# Patient Record
Sex: Female | Born: 1995 | Race: White | Hispanic: No | Marital: Married | Smoking: Never smoker
Health system: Southern US, Community
[De-identification: ages and names within clinical notes are randomized; demographics above are authoritative.]

## PROBLEM LIST (undated history)

## (undated) DIAGNOSIS — R569 Unspecified convulsions: Secondary | ICD-10-CM

## (undated) DIAGNOSIS — F988 Other specified behavioral and emotional disorders with onset usually occurring in childhood and adolescence: Secondary | ICD-10-CM

## (undated) DIAGNOSIS — G43909 Migraine, unspecified, not intractable, without status migrainosus: Secondary | ICD-10-CM

## (undated) HISTORY — DX: Other specified behavioral and emotional disorders with onset usually occurring in childhood and adolescence: F98.8

## (undated) HISTORY — PX: NO PAST SURGERIES: SHX2092

## (undated) HISTORY — DX: Unspecified convulsions: R56.9

---
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
## 2015-02-22 ENCOUNTER — Encounter: Payer: Self-pay | Admitting: Neurology

## 2015-02-22 ENCOUNTER — Ambulatory Visit (INDEPENDENT_AMBULATORY_CARE_PROVIDER_SITE_OTHER): Payer: Managed Care, Other (non HMO) | Admitting: Neurology

## 2015-02-22 VITALS — BP 102/70 | Ht 61.0 in | Wt 190.8 lb

## 2015-02-22 DIAGNOSIS — G40B09 Juvenile myoclonic epilepsy, not intractable, without status epilepticus: Secondary | ICD-10-CM

## 2015-02-22 MED ORDER — LEVETIRACETAM 750 MG PO TABS: 750.0000 mg | ORAL_TABLET | Freq: Two times a day (BID) | ORAL | Status: DC

## 2015-02-22 NOTE — Progress Notes (Signed)
Patient: Alexis Mills MRN: [id number] Sex: female DOB: 1996/03/17  Provider: Keturah Shavers, MD Location of Care: [facility] Child Neurology  Note type: Routine return visit  Referral Source: Dr. Ronnette Juniper History from: patient, [facility] chart and mother Chief Complaint: Juvenile myoclonic epilepsy  History of Present Illness: Alexis Mills is a 19 y.o. female is here for follow-up management of seizure disorder. She has diagnosis of juvenile myoclonic epilepsy since 2014 with no clinical seizure activity since starting medication at that time also she was having occasional myoclonic jerks initially. She has been on Keppra with medium dose of 750 mg twice a day with good seizure control. She has been tolerating medication well with no side effects. Her last EEG was in February 2016 which was slightly abnormal with sporadic generalized discharges but with significant improvement compared to her initial EEG which contained frequent photoparoxysmal responses. Over the past several months, she has had no other issues, no behavioral changes. She usually sleeps well without any difficulty. She is going to start college soon in a community college close to her house. She is still not driving.  Review of Systems: 12 system review as per HPI, otherwise negative.  Past Medical History  Diagnosis Date  . ADD (attention deficit disorder)   . Seizures    Hospitalizations: No., Head Injury: No., Nervous System Infections: No., Immunizations up to date: Yes.    Surgical History History reviewed. No pertinent past surgical history.  Family History family history is not on file. She was adopted.   Social History Social History   Social History  . Marital Status: Single    Spouse Name: N/A  . Number of Children: N/A  . Years of Education: N/A   Social History Main Topics  . Smoking status: Never Smoker   . Smokeless tobacco: Never Used  . Alcohol Use: No  . Drug Use: No   . Sexual Activity: Yes    Birth Control/ Protection: Pill, Injection     Comment: Depo   Other Topics Concern  . None   Social History Narrative   Patient was adopted at 19 year of age.   Educational level 12th grade School Attending: [employer]. Occupation: Consulting civil engineer  Living with both parents  School comments Aviella will be entering [employer] this upcoming school year. She plans on studying to be a International aid/development worker.  The medication list was reviewed and reconciled. All changes or newly prescribed medications were explained.  A complete medication list was provided to the patient/caregiver.  Allergies  Allergen Reactions  . Other     Seasonal Allergies    Physical Exam BP 102/70 mmHg  Ht 5\' 1"  (1.549 m)  Wt 190 lb 12.8 oz (86.546 kg)  BMI 36.07 kg/m2  LMP  Gen: Awake, alert, not in distress Skin: No rash, No neurocutaneous stigmata. HEENT: Normocephalic,  nares patent, mucous membranes moist, oropharynx clear. Neck: Supple, no meningismus. No focal tenderness. Resp: Clear to auscultation bilaterally CV: Regular rate, normal S1/S2, no murmurs, no rubs Abd: BS present, abdomen soft, non-tender, non-distended. No hepatosplenomegaly or mass Ext: Warm and well-perfused. No deformities, no muscle wasting, ROM full.  Neurological Examination: MS: Awake, alert, interactive. Normal eye contact, answered the questions appropriately,  Normal comprehension.  Attention and concentration were normal. Cranial Nerves: Pupils were equal and reactive to light ( 5-52mm);  visual field full with confrontation test; EOM normal, no nystagmus; no ptsosis, no double vision, intact facial sensation, face symmetric with  full strength of facial muscles, hearing intact to finger rub bilaterally, palate elevation is symmetric, tongue protrusion is symmetric with full movement to both sides.  Sternocleidomastoid and trapezius are with normal  strength. Tone-Normal Strength-Normal strength in all muscle groups DTRs-  Biceps Triceps Brachioradialis Patellar Ankle  R 2+ 2+ 2+ 2+ 2+  L 2+ 2+ 2+ 2+ 2+   Plantar responses flexor bilaterally, no clonus noted Sensation: Intact to light touch, Romberg negative. Coordination: No dysmetria on FTN test. No difficulty with balance. Gait: Normal walk and run. Tandem gait was normal.    Assessment and Plan 1. Juvenile myoclonic epilepsy, not intractable, without status epilepticus    This is an 19 year old young female with history of generalized seizure disorder, most likely juvenile myoclonic epilepsy with generalized discharges on EEG as well as photoparoxysmal response with improvement on her last EEG in February of this year. She has no focal findings on her neurological examination, tolerating medication well with no side effects. I will continue the same dose of medication for for now. I discussed with patient regarding the seizure triggers including lack of sleep and bright light and also discussed seizure precautions. If there is no more clinical seizure activity, she will continue with the same dose of medication otherwise if there is any clinical seizure, I may increase the dose of medication. I told patient that she needs to continue medication at least for another year and at that point if there is no clinical seizure and normal EEG then we may try to discontinue medication although with this type of seizure she might need to be on medication for life but occasionally it would be worth to try to discontinue medication since 20-30% of these patients may not have more seizure. I would like to see her back in 6 months for follow-up visit and then I will schedule her for another EEG at that point. She and her mother understood and agreed with the plan.  Meds ordered this encounter  Medications  . Multiple Vitamins-Minerals (MULTIVITAMIN WITH MINERALS) tablet    Sig: Take 1 tablet by  mouth daily.  Marland Kitchen levETIRAcetam (KEPPRA) 750 MG tablet    Sig: Take 1 tablet (750 mg total) by mouth 2 (two) times daily.    Dispense:  186 tablet    Refill:  6

## 2015-09-04 ENCOUNTER — Encounter: Payer: Self-pay | Admitting: Neurology

## 2015-09-04 ENCOUNTER — Ambulatory Visit (INDEPENDENT_AMBULATORY_CARE_PROVIDER_SITE_OTHER): Payer: Managed Care, Other (non HMO) | Admitting: Neurology

## 2015-09-04 VITALS — BP 110/64 | Ht 61.75 in | Wt 186.8 lb

## 2015-09-04 DIAGNOSIS — G40B09 Juvenile myoclonic epilepsy, not intractable, without status epilepticus: Secondary | ICD-10-CM | POA: Diagnosis not present

## 2015-09-04 MED ORDER — LEVETIRACETAM 750 MG PO TABS: 750.0000 mg | ORAL_TABLET | Freq: Two times a day (BID) | ORAL | Status: DC

## 2015-09-04 NOTE — Progress Notes (Signed)
Patient: Alexis Mills MRN: [id number] Sex: female DOB: 01/29/1996  Provider: Keturah Shavers, MD Location of Care: [facility]  Note type: Routine return visit  Referral Source: Dr. Ronnette Juniper History from: patient, referring office, [facility] chart and mother Chief Complaint: Epilepsy  History of Present Illness: Alexis Mills is a 20 y.o. female is here for follow-up management of seizure disorder. She has a diagnosis of generalized seizure disorder, most likely juvenile myoclonic epilepsy based on her EEG findings with photoparoxysmal response, diagnosed in 2014 and has been on Keppra since then with good seizure control and no clinical seizure activity since then. She has been tolerating medication well with no side effects. Her last EEG was in February 2016 with sporadic generalized discharges but with some improvement compared to her previous EEG. Since her last visit, over the past 6 months she has had no clinical seizure, no abnormal behavior. She usually sleeps well without any difficulty. She is doing well academically in college.  She is adopted but recently she found out about her biological family with possible family history of epilepsy but she is not sure exactly who had seizure in her biological family.  Review of Systems: 12 system review as per HPI, otherwise negative.  Past Medical History  Diagnosis Date  . ADD (attention deficit disorder)   . Seizures (HCC)    Hospitalizations: No., Head Injury: No., Nervous System Infections: No., Immunizations up to date: Yes.    Surgical History History reviewed. No pertinent past surgical history.  Family History family history is not on file. She was adopted.  Social History Social History   Social History  . Marital Status: Single    Spouse Name: N/A  . Number of Children: N/A  . Years of Education: N/A   Social History Main Topics  . Smoking status: Never Smoker   . Smokeless tobacco:  Never Used  . Alcohol Use: No  . Drug Use: No  . Sexual Activity: Yes    Birth Control/ Protection: Pill, Injection     Comment: Depo   Other Topics Concern  . None   Social History Narrative   Drina attends [employer]. She is doing well.   Lives with her adoptive parents. Her adoptive siblings are adult aged and do not live at home.   Patient was adopted at 20 year of age.     The medication list was reviewed and reconciled. All changes or newly prescribed medications were explained.  A complete medication list was provided to the patient/caregiver.  Allergies  Allergen Reactions  . Other     Seasonal Allergies    Physical Exam BP 110/64 mmHg  Ht 5' 1.75" (1.568 m)  Wt 186 lb 12.8 oz (84.732 kg)  BMI 34.46 kg/m2 Gen: Awake, alert, not in distress Skin: No rash, No neurocutaneous stigmata. HEENT: Normocephalic,  nares patent, mucous membranes moist, oropharynx clear. Neck: Supple, no meningismus. No focal tenderness. Resp: Clear to auscultation bilaterally CV: Regular rate, normal S1/S2, no murmurs, no rubs Abd: BS present, abdomen soft, non-tender, non-distended. No hepatosplenomegaly or mass Ext: Warm and well-perfused. No deformities, no muscle wasting, ROM full.  Neurological Examination: MS: Awake, alert, interactive. Normal eye contact, answered the questions appropriately, speech was fluent,  Normal comprehension.  Attention and concentration were normal. Cranial Nerves: Pupils were equal and reactive to light ( 5-12mm);   visual field full with confrontation test; EOM normal, no nystagmus; no ptsosis, no double vision, intact facial sensation, face  symmetric with full strength of facial muscles, hearing intact to finger rub bilaterally, palate elevation is symmetric, tongue protrusion is symmetric with full movement to both sides.  Sternocleidomastoid and trapezius are with normal strength. Tone-Normal Strength-Normal strength in all muscle  groups DTRs-  Biceps Triceps Brachioradialis Patellar Ankle  R 2+ 2+ 2+ 2+ 2+  L 2+ 2+ 2+ 2+ 2+   Plantar responses flexor bilaterally, no clonus noted Sensation: Intact to light touch,  Romberg negative. Coordination: No dysmetria on FTN test. No difficulty with balance. Gait: Normal walk and run. Tandem gait was normal.  Assessment and Plan 1. Juvenile myoclonic epilepsy, not intractable, without status epilepticus (HCC)    This is a 21 year old young female with juvenile myoclonic epilepsy based on her clinical seizure activity and EEG findings with good seizure control over the past couple of years on moderate dose of Keppra at 750 mg twice a day. She has no focal findings on her neurological examination and doing fine otherwise. Recommend to continue the same dose of Keppra at this time. I discussed regarding seizure precautions and also the seizure triggers with patient. Recommend to perform a follow-up EEG in the next couple of months. If her EEG shows abnormal discharges, she definitely continue medication for several more years and possibly for life but if her EEG is negative and she continued to be seizure-free for the next year then we may discuss the option of tapering the medication to try if she remains symptom free without medication since there would be a 20-30% chance that she may remain seizure-free off of medication although most of the patients with juvenile myoclonic epilepsy should be on seizure medication for life. I also recommend patient to try to do regular exercise and watch her diet and if possible avoid gaining weight. The other point I discussed with patient is transitioning to adult neurology but I will continue following her over the next couple of years until she is comfortable to be transferred to adult neurology service. I would like to see her in 6 months for follow-up visit and I will call mother with the results of EEG.   Meds ordered this encounter   Medications  . MedroxyPROGESTERone Acetate 150 MG/ML SUSY    Sig:   . levETIRAcetam (KEPPRA) 750 MG tablet    Sig: Take 1 tablet (750 mg total) by mouth 2 (two) times daily.    Dispense:  186 tablet    Refill:  6   Orders Placed This Encounter  Procedures  . Child sleep deprived EEG    Standing Status: Future     Number of Occurrences:      Standing Expiration Date: 09/03/2016

## 2015-10-15 ENCOUNTER — Ambulatory Visit (HOSPITAL_COMMUNITY)
Admission: RE | Admit: 2015-10-15 | Discharge: 2015-10-15 | Disposition: A | Discharge status: Home / Self Care | Payer: Managed Care, Other (non HMO) | Source: Ambulatory Visit | Attending: Neurology | Admitting: Neurology

## 2015-10-15 DIAGNOSIS — G40B09 Juvenile myoclonic epilepsy, not intractable, without status epilepticus: Secondary | ICD-10-CM | POA: Diagnosis present

## 2015-10-15 DIAGNOSIS — Z79899 Other long term (current) drug therapy: Secondary | ICD-10-CM | POA: Diagnosis not present

## 2015-10-15 NOTE — Progress Notes (Signed)
S/D EEG completed; results pending  

## 2015-10-17 NOTE — Procedures (Signed)
Patient:  Alexis Mills   Sex: female  DOB:  06/06/1996  Date of study: 10/15/2015  Clinical history: This is a 20 year old young female with history of generalized seizure disorder, possibly juvenile myoclonic epilepsy with good seizure control on moderate dose of medication. This is a follow-up EEG for evaluation of electrographic discharges. EEG was done with sleep deprivation but patient did not have any sleep during the EEG.  Medication: Keppra  Procedure: The tracing was carried out on a 32 channel digital Cadwell recorder reformatted into 16 channel montages with 1 devoted to EKG.  The 10 /20 international system electrode placement was used. Recording was done during awake state. Recording time 53.5 Minutes.   Description of findings: Background rhythm consists of amplitude of  55  microvolt and frequency of 10-11 hertz posterior dominant rhythm. There was normal anterior posterior gradient noted. Background was well organized, continuous and symmetric with no focal slowing.  Hyperventilation resulted in slowing of the background activity. Photic simulation using stepwise increase in photic frequency resulted in bilateral symmetric driving response. Throughout the recording there were no focal or generalized epileptiform activities in the form of spikes or sharps noted except for one single spike and slow wave in bilateral frontal area. There were no transient rhythmic activities or electrographic seizures noted. One lead EKG rhythm strip revealed sinus rhythm at a rate of 80 bpm.  Impression: This EEG is unremarkable during awake state except for one single spike in bilateral frontal area. Please note that normal EEG does not exclude epilepsy, clinical correlation is indicated.     Keturah ShaversNABIZADEH, Alexis Giarrusso, MD

## 2015-11-06 ENCOUNTER — Telehealth: Payer: Self-pay

## 2015-11-06 NOTE — Telephone Encounter (Signed)
Alexis Mills, mom, lvm inquiring about recent SD EEG results. CB# [phone]

## 2015-11-06 NOTE — Telephone Encounter (Signed)
Spoke with Marylene LandAngela, mom, and let her know the SD EEG results were normal. Child will continue the same dose of medication for now until seen at her next office visit. Mother expressed understanding and did not have any additional questions or concerns at this time.

## 2015-11-06 NOTE — Telephone Encounter (Signed)
Lvm for Marylene Landngela asking her to call me back.

## 2016-06-20 ENCOUNTER — Ambulatory Visit (INDEPENDENT_AMBULATORY_CARE_PROVIDER_SITE_OTHER): Payer: Managed Care, Other (non HMO) | Admitting: Neurology

## 2016-06-20 ENCOUNTER — Encounter (INDEPENDENT_AMBULATORY_CARE_PROVIDER_SITE_OTHER): Payer: Self-pay | Admitting: Neurology

## 2016-06-20 VITALS — BP 122/70 | Ht 62.0 in | Wt 180.8 lb

## 2016-06-20 DIAGNOSIS — G40B09 Juvenile myoclonic epilepsy, not intractable, without status epilepticus: Secondary | ICD-10-CM | POA: Diagnosis not present

## 2016-06-20 MED ORDER — LEVETIRACETAM 750 MG PO TABS: 750.0000 mg | ORAL_TABLET | Freq: Two times a day (BID) | ORAL | 3 refills | Status: DC

## 2016-06-20 NOTE — Progress Notes (Signed)
Patient: Alexis Mills MRN: [id number] Sex: female DOB: 02/22/1996  Provider: Keturah ShaversNABIZADEH, Tynasia Mccaul, MD [facility] Care[facility]  Note type: Routine return visit  Referral Source: Ronnette JuniperJoseph Pringle, MD [facility] Chief Complaint: Epilepsy   History of Present Illness: Alexis Mills is a 20 y.o. female is here for follow-up management of seizure disorder. She has diagnosis of juvenile myoclonic epilepsy diagnosed in 2014 and since then she has been on Keppra, currently 750 mg twice a day with good seizure control and no clinical seizure activity since starting medication, tolerating medication well with no side effects. Her last EEG was in April 2017 was negative except for one single frontal spikes bilaterally, although patient was sleep deprived but she did not have any sleep during the EEG. Since her last visit in April she has had no clinical seizure activity, doing fine with no other medical issues, no jerking or shaking and no difficulty sleeping through the night. She is doing well academically and has no other concerns or complaints.  Review of Systems: 12 system review as per HPI, otherwise negative.  Past Medical History:  Diagnosis Date  . ADD (attention deficit disorder)   . Seizures (HCC)    Hospitalizations: No., Head Injury: No., Nervous System Infections: No., Immunizations up to date: Yes.     Surgical History History reviewed. No pertinent surgical history.  Family History family history is not on file. She was adopted.   Social History Social History   Social History  . Marital status: Single    Spouse name: N/A  . Number of children: N/A  . Years of education: N/A   Social History Main Topics  . Smoking status: Never Smoker  . Smokeless tobacco: Never Used  . Alcohol use No  . Drug use: No  . Sexual activity: Yes    Birth control/ protection: Pill, Injection     Comment: Depo   Other Topics  Concern  . None   Social History Narrative   Anela attends [employer]. She is doing well.   Lives with her adoptive parents. Her adoptive siblings are adult aged and do not live at home.   Patient was adopted at 20 year of age.    The medication list was reviewed and reconciled. All changes or newly prescribed medications were explained.  A complete medication list was provided to the patient/caregiver.  Allergies  Allergen Reactions  . Other     Seasonal Allergies    Physical Exam BP 122/70   Ht 5\' 2"  (1.575 m)   Wt 180 lb 12.8 oz (82 kg)   BMI 33.07 kg/m  Gen: Awake, alert, not in distress Skin: No rash, No neurocutaneous stigmata. HEENT: Normocephalic, no conjunctival injection, nares patent, mucous membranes moist, oropharynx clear. Neck: Supple, no meningismus. No focal tenderness. Resp: Clear to auscultation bilaterally CV: Regular rate, normal S1/S2, no murmurs, no rubs Abd: abdomen soft, non-tender, non-distended. No hepatosplenomegaly or mass Ext: Warm and well-perfused. No deformities, no muscle wasting, ROM full.  Neurological Examination: MS: Awake, alert, interactive. Normal eye contact, answered the questions appropriately, speech was fluent,  Normal comprehension.  Attention and concentration were normal. Cranial Nerves: Pupils were equal and reactive to light ( 5-63mm);  normal fundoscopic exam with sharp discs, visual field full with confrontation test; EOM normal, no nystagmus; no ptsosis, no double vision, intact facial sensation, face symmetric with full strength of facial muscles, hearing intact to finger rub bilaterally, palate  elevation is symmetric, tongue protrusion is symmetric with full movement to both sides.  Sternocleidomastoid and trapezius are with normal strength. Tone-Normal Strength-Normal strength in all muscle groups DTRs-  Biceps Triceps Brachioradialis Patellar Ankle  R 2+ 2+ 2+ 2+ 2+  L 2+ 2+ 2+ 2+ 2+   Plantar  responses flexor bilaterally, no clonus noted Sensation: Intact to light touch,  Romberg negative. Coordination: No dysmetria on FTN test. No difficulty with balance. Gait: Normal walk and run. Tandem gait was normal.   Assessment and Plan 1. Juvenile myoclonic epilepsy, not intractable, without status epilepticus (HCC)    This is a 20 year old young female with juvenile myoclonic epilepsy with no clinical seizure activity since his starting Keppra, currently on 750 twice a day with no side effects. She has no focal findings on her neurological examination. Recommended to continue the same dose of medication for now. I gave her the option of switching to long-acting medication to take once a day but she would like to continue as it is for now. I do not think she needs follow-up EEG at this point but recommended to have another EEG in about 6 months during the summer time and then see her after the EEG for follow-up visit. If she continues to be seizure free with normal EEG then we may discuss gradual tapering of the medication over 6 months if she remains symptom-free. She and her mother understood and agreed with the plan.  Meds ordered this encounter  Medications  . levETIRAcetam (KEPPRA) 750 MG tablet    Sig: Take 1 tablet (750 mg total) by mouth 2 (two) times daily.    Dispense:  186 tablet    Refill:  3

## 2016-06-20 NOTE — Patient Instructions (Signed)
Call in July to schedule for a sleep deprived EEG prior to your next appointment. Continue taking the same dose of Keppra

## 2016-10-31 ENCOUNTER — Ambulatory Visit (INDEPENDENT_AMBULATORY_CARE_PROVIDER_SITE_OTHER): Payer: Managed Care, Other (non HMO)

## 2016-10-31 DIAGNOSIS — Z3042 Encounter for surveillance of injectable contraceptive: Secondary | ICD-10-CM | POA: Diagnosis not present

## 2016-10-31 MED ORDER — MEDROXYPROGESTERONE ACETATE 150 MG/ML IM SUSP
150.0000 mg | Freq: Once | INTRAMUSCULAR | Status: AC
  Administered 2016-10-31: 150 mg via INTRAMUSCULAR

## 2016-11-03 ENCOUNTER — Ambulatory Visit: Payer: Managed Care, Other (non HMO)

## 2016-12-11 ENCOUNTER — Other Ambulatory Visit (INDEPENDENT_AMBULATORY_CARE_PROVIDER_SITE_OTHER): Payer: Self-pay | Admitting: *Deleted

## 2016-12-11 DIAGNOSIS — G40B09 Juvenile myoclonic epilepsy, not intractable, without status epilepticus: Secondary | ICD-10-CM

## 2017-01-23 ENCOUNTER — Ambulatory Visit (INDEPENDENT_AMBULATORY_CARE_PROVIDER_SITE_OTHER): Payer: Managed Care, Other (non HMO)

## 2017-01-23 DIAGNOSIS — Z3042 Encounter for surveillance of injectable contraceptive: Secondary | ICD-10-CM

## 2017-01-23 MED ORDER — MEDROXYPROGESTERONE ACETATE 150 MG/ML IM SUSP
150.0000 mg | Freq: Once | INTRAMUSCULAR | Status: AC
  Administered 2017-01-23: 150 mg via INTRAMUSCULAR

## 2017-02-02 ENCOUNTER — Other Ambulatory Visit (HOSPITAL_COMMUNITY): Payer: Managed Care, Other (non HMO)

## 2017-03-02 ENCOUNTER — Ambulatory Visit (HOSPITAL_COMMUNITY)
Admission: RE | Admit: 2017-03-02 | Discharge: 2017-03-02 | Disposition: A | Discharge status: Home / Self Care | Payer: Managed Care, Other (non HMO) | Source: Ambulatory Visit | Attending: Family | Admitting: Family

## 2017-03-02 DIAGNOSIS — G40B09 Juvenile myoclonic epilepsy, not intractable, without status epilepticus: Secondary | ICD-10-CM | POA: Insufficient documentation

## 2017-03-02 NOTE — Procedures (Signed)
Patient:  Alexis Mills   Sex: female  DOB:  1996/01/17  Date of study: 03/02/2017  Clinical history: This is a 21 year old young female with history of generalized seizure disorder, possibly juvenile myoclonic epilepsy with good seizure control on moderate dose of medication. This is a follow-up EEG for evaluation of electrographic discharges. EEG was done with sleep deprivation.  Medication: Keppra  Procedure: The tracing was carried out on a 32 channel digital Cadwell recorder reformatted into 16 channel montages with 1 devoted to EKG.  The 10 /20 international system electrode placement was used. Recording was done during awake, drowsiness and sleep states. Recording time 45.5 Minutes.   Description of findings: Background rhythm consists of amplitude of 60 microvolt and frequency of 11 hertz posterior dominant rhythm. There was normal anterior posterior gradient noted. Background was well organized, continuous and symmetric with no focal slowing.  During drowsiness and sleep there was gradual decrease in background frequency noted. During the early stages of sleep there were symmetrical sleep spindles and vertex sharp waves as well as K complexes noted.  Hyperventilation resulted in no significant slowing of the background activity. Photic stimulation using stepwise increase in photic frequency resulted in bilateral symmetric driving response. Throughout the recording there were no focal or generalized epileptiform activities in the form of spikes or sharps noted. There were no transient rhythmic activities or electrographic seizures noted. There were occasional more generalized discharges noted during sleep and in between vertex sharp waves which were most likely K complexes. One lead EKG rhythm strip revealed sinus rhythm at a rate of 65 bpm.  Impression: This EEG is unremarkable during awake and asleep states. Please note that normal EEG does not exclude epilepsy, clinical correlation  is indicated.     Keturah Shavers, MD

## 2017-03-02 NOTE — Progress Notes (Signed)
OP adult sleep deprived EEG completed, results pending. 

## 2017-04-08 ENCOUNTER — Other Ambulatory Visit: Payer: Self-pay | Admitting: Advanced Practice Midwife

## 2017-04-08 NOTE — Telephone Encounter (Signed)
Please advise 

## 2017-04-17 ENCOUNTER — Ambulatory Visit (INDEPENDENT_AMBULATORY_CARE_PROVIDER_SITE_OTHER): Payer: Managed Care, Other (non HMO)

## 2017-04-17 DIAGNOSIS — Z308 Encounter for other contraceptive management: Secondary | ICD-10-CM

## 2017-04-17 MED ORDER — MEDROXYPROGESTERONE ACETATE 150 MG/ML IM SUSP
150.0000 mg | Freq: Once | INTRAMUSCULAR | Status: AC
  Administered 2017-04-17: 150 mg via INTRAMUSCULAR

## 2017-04-17 NOTE — Progress Notes (Signed)
Pt here for depo inj which was given IM right glut.  NDC# [address]-2 

## 2017-05-25 ENCOUNTER — Encounter: Payer: Self-pay | Admitting: Advanced Practice Midwife

## 2017-05-25 ENCOUNTER — Ambulatory Visit (INDEPENDENT_AMBULATORY_CARE_PROVIDER_SITE_OTHER): Payer: Managed Care, Other (non HMO) | Admitting: Advanced Practice Midwife

## 2017-05-25 VITALS — BP 118/74 | Ht 61.0 in | Wt 201.0 lb

## 2017-05-25 DIAGNOSIS — Z3042 Encounter for surveillance of injectable contraceptive: Secondary | ICD-10-CM

## 2017-05-25 DIAGNOSIS — Z0000 Encounter for general adult medical examination without abnormal findings: Secondary | ICD-10-CM

## 2017-05-25 DIAGNOSIS — Z01419 Encounter for gynecological examination (general) (routine) without abnormal findings: Secondary | ICD-10-CM

## 2017-05-25 MED ORDER — MEDROXYPROGESTERONE ACETATE 150 MG/ML IM SUSY: 1.0000 mL | PREFILLED_SYRINGE | INTRAMUSCULAR | 0 refills | Status: DC

## 2017-05-25 NOTE — Progress Notes (Signed)
Patient ID: Alexis Mills, female   DOB: 12/02/1995, 21 y.o.   MRN: [id number]     Gynecology Annual Exam  PCP: Lyndon CodeKhan, Fozia M, MD  Chief Complaint:  Chief Complaint  Patient presents with  . Annual Exam    History of Present Illness: Patient is a 21 y.o. No obstetric history on file. presents for annual exam. The patient has no complaints today.   LMP: No LMP recorded. Patient has had an injection. Menarche:not applicable Average Interval: not having bleeding Postcoital Bleeding: not applicable Dysmenorrhea: not applicable  The patient is not currently sexually active. She currently uses Depo-Provera injections for contraception. She denies dyspareunia.  The patient does perform self breast exams.  The patient does not know if there is a family history of breast or ovarian cancer. She is adopted.   The patient wears seatbelts: yes.  The patient has regular exercise: yes.  She admits to healthy lifestyle diet and adequate h2o intake.  The patient denies current symptoms of depression.    Review of Systems: Review of Systems  Constitutional: Negative.   HENT: Negative.   Eyes: Negative.   Respiratory: Negative.   Cardiovascular: Negative.   Gastrointestinal: Negative.   Genitourinary: Negative.   Musculoskeletal: Negative.   Skin: Negative.   Neurological: Negative.   Endo/Heme/Allergies: Negative.   Psychiatric/Behavioral: Negative.     Past Medical History:  Past Medical History:  Diagnosis Date  . ADD (attention deficit disorder)   . Seizures (HCC)     Past Surgical History:  History reviewed. No pertinent surgical history.  Gynecologic History:  No LMP recorded. Patient has had an injection. Contraception: Depo-Provera injections Last Pap: The patient has never had a PAP smear. She had a negative Aptima last year.  Obstetric History: No obstetric history on file.  Family History:  Family History  Adopted: Yes    Social History:  Social History    Socioeconomic History  . Marital status: Single    Spouse name: Not on file  . Number of children: Not on file  . Years of education: Not on file  . Highest education level: Not on file  Social Needs  . Financial resource strain: Not on file  . Food insecurity - worry: Not on file  . Food insecurity - inability: Not on file  . Transportation needs - medical: Not on file  . Transportation needs - non-medical: Not on file  Occupational History  . Not on file  Tobacco Use  . Smoking status: Never Smoker  . Smokeless tobacco: Never Used  Substance and Sexual Activity  . Alcohol use: No  . Drug use: No  . Sexual activity: Yes    Birth control/protection: Injection    Comment: Depo  Other Topics Concern  . Not on file  Social History Narrative   Cyriah attends [employer]. She is doing well.   Lives with her adoptive parents. Her adoptive siblings are adult aged and do not live at home.   Patient was adopted at 21 year of age.    Allergies:  Allergies  Allergen Reactions  . Other     Seasonal Allergies    Medications: Prior to Admission medications   Medication Sig Start Date End Date Taking? Authorizing Provider  fexofenadine-pseudoephedrine (ALLEGRA-D 24) 180-240 MG per 24 hr tablet Take 1 tablet by mouth daily.   Yes [provider]  fluticasone (FLONASE) 50 MCG/ACT nasal spray Place 2 sprays into the nose 2 (two) times daily.  Yes [provider]  levETIRAcetam (KEPPRA) 750 MG tablet Take 1 tablet (750 mg total) by mouth 2 (two) times daily. 06/20/16  Yes Keturah ShaversNabizadeh, Reza, MD  MedroxyPROGESTERone Acetate 150 MG/ML SUSY Inject 1 mL (150 mg total) every 3 (three) months into the muscle. 05/25/17  Yes Tresea MallGledhill, Kairy Folsom, CNM  Multiple Vitamins-Minerals (MULTIVITAMIN WITH MINERALS) tablet Take 1 tablet by mouth daily.   Yes [provider]  vitamin B-6 (VITAMIN B-6) 50 MG tablet Take 1 tablet (50 mg total) by mouth daily. Patient not  taking: Reported on 05/25/2017 12/21/12   Keturah ShaversNabizadeh, Reza, MD    Physical Exam Vitals: Blood pressure 118/74, height 5\' 1"  (1.549 m), weight 201 lb (91.2 kg).  General: NAD HEENT: normocephalic, anicteric Thyroid: no enlargement, no palpable nodules Pulmonary: No increased work of breathing, CTAB Cardiovascular: RRR, distal pulses 2+ Breast: Breast symmetrical, no tenderness, no palpable nodules or masses, no skin or nipple retraction present, no nipple discharge.  No axillary or supraclavicular lymphadenopathy. Abdomen: NABS, soft, non-tender, non-distended.  Umbilicus without lesions.  No hepatomegaly, splenomegaly or masses palpable. No evidence of hernia  Genitourinary:  Deferred for no concerns, not currently sexually active, no PAP smear today Extremities: no edema, erythema, or tenderness Neurologic: Grossly intact Psychiatric: mood appropriate, affect full   Assessment: 21 y.o. No obstetric history on file. routine annual exam  Plan: Problem List Items Addressed This Visit    None    Visit Diagnoses    Well woman exam without gynecological exam    -  Primary   Encounter for surveillance of injectable contraceptive       Relevant Medications   MedroxyPROGESTERone Acetate 150 MG/ML SUSY      1) 4) Gardasil Series discussed and if applicable offered to patient - Patient has previously completed 3 shot series   2) STI screening was offered and declined   3) ASCCP guidelines and rational discussed.  Patient opts for beginning when she becomes sexually active screening interval  4) Contraception - continue with Depo   5) Follow up 1 year for routine annual exam  Tresea MallJane Devory Mckinzie, CNM

## 2017-07-09 ENCOUNTER — Other Ambulatory Visit: Payer: Self-pay | Admitting: Advanced Practice Midwife

## 2017-07-09 DIAGNOSIS — Z3042 Encounter for surveillance of injectable contraceptive: Secondary | ICD-10-CM

## 2017-07-10 ENCOUNTER — Ambulatory Visit (INDEPENDENT_AMBULATORY_CARE_PROVIDER_SITE_OTHER): Payer: Managed Care, Other (non HMO)

## 2017-07-10 DIAGNOSIS — Z3042 Encounter for surveillance of injectable contraceptive: Secondary | ICD-10-CM

## 2017-07-10 MED ORDER — MEDROXYPROGESTERONE ACETATE 150 MG/ML IM SUSP
150.0000 mg | Freq: Once | INTRAMUSCULAR | Status: AC
  Administered 2017-07-10: 150 mg via INTRAMUSCULAR

## 2017-07-30 ENCOUNTER — Encounter: Payer: Self-pay | Admitting: Advanced Practice Midwife

## 2017-09-25 ENCOUNTER — Ambulatory Visit: Payer: Managed Care, Other (non HMO)

## 2018-03-11 ENCOUNTER — Ambulatory Visit: Payer: Self-pay | Admitting: Adult Health

## 2018-03-17 ENCOUNTER — Ambulatory Visit: Payer: Self-pay | Admitting: Adult Health

## 2018-04-07 ENCOUNTER — Encounter (INDEPENDENT_AMBULATORY_CARE_PROVIDER_SITE_OTHER): Payer: Self-pay | Admitting: Neurology

## 2018-04-07 ENCOUNTER — Ambulatory Visit (INDEPENDENT_AMBULATORY_CARE_PROVIDER_SITE_OTHER): Payer: Managed Care, Other (non HMO) | Admitting: Neurology

## 2018-04-07 ENCOUNTER — Telehealth (INDEPENDENT_AMBULATORY_CARE_PROVIDER_SITE_OTHER): Payer: Self-pay | Admitting: Neurology

## 2018-04-07 VITALS — BP 112/72 | HR 74 | Ht 61.02 in | Wt 197.8 lb

## 2018-04-07 DIAGNOSIS — G40B09 Juvenile myoclonic epilepsy, not intractable, without status epilepticus: Secondary | ICD-10-CM | POA: Diagnosis not present

## 2018-04-07 MED ORDER — LEVETIRACETAM 750 MG PO TABS: ORAL_TABLET | ORAL | 3 refills | Status: DC

## 2018-04-07 MED ORDER — LEVETIRACETAM 250 MG PO TABS: ORAL_TABLET | ORAL | 2 refills | Status: DC

## 2018-04-07 NOTE — Progress Notes (Signed)
Patient: Alexis Mills MRN: [id number] Sex: female DOB: [birth year]  Provider: Keturah Shavers, MD Location of Care: [facility] Child Neurology  Note type: Routine return visit  Referral Source: Ronnette Juniper, MD History from: patient, [facility] chart and Mom Chief Complaint: Epilepsy  History of Present Illness:  Alexis Mills is a 22 y.o. 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 Her last visit was 06/20/2016 at which time she had been stable on keppra 750 mg BID, had not had a seizure since 2014. She had an EEG done 03/02/2017 which was unremarkable.  Since her last visit, she has not had any seizures. Reports that her last seizure was over four years ago. Has occasionally "felt off" - meaning that she has a feeling like she is about to have a seizure, although she does not have one. These feelings occur rarely and can be triggered by sleep deprivation, excessive caffeine, or flashing lights.   This past year has been stressful with her grandmother passing away, but the stress is starting to lessen now. She is currently enrolled at [employer] in the early childhood education program, and she is working part time at a preschool. She has been cleared to drive but has not taken the steps to get her license due to the family stressors around her grandmother's health.  Review of Systems: 12 system review as per HPI, otherwise negative.  Has occasional headaches, about once every few months, but these are much less frequent than they used to be  Past Medical History:  Diagnosis Date  . ADD (attention deficit disorder)   . Seizures (HCC)    Hospitalizations: No., Head Injury: No., Nervous System Infections: No., Immunizations up to date: Yes.    Birth History Adopted - unknown birth hx except for known neonatal abstinence syndrome  Surgical History History reviewed. No pertinent surgical history.  Family  History family history is not on file. She was adopted.  Unknown family history due to being adopted  Social History Social History   Socioeconomic History  . Marital status: Single    Spouse name: Not on file  . Number of children: Not on file  . Years of education: Not on file  . Highest education level: Not on file  Occupational History  . Not on file  Social Needs  . Financial resource strain: Not on file  . Food insecurity:    Worry: Not on file    Inability: Not on file  . Transportation needs:    Medical: Not on file    Non-medical: Not on file  Tobacco Use  . Smoking status: Never Smoker  . Smokeless tobacco: Never Used  Substance and Sexual Activity  . Alcohol use: No  . Drug use: No  . Sexual activity: Yes    Birth control/protection: Injection    Comment: Depo  Lifestyle  . Physical activity:    Days per week: Not on file    Minutes per session: Not on file  . Stress: Not on file  Relationships  . Social connections:    Talks on phone: Not on file    Gets together: Not on file    Attends religious service: Not on file    Active member of club or organization: Not on file    Attends meetings of clubs or organizations: Not on file    Relationship status: Not on file  Other Topics Concern  . Not on file  Social  History Narrative   Alexis Mills attends [employer]. She is doing well.   Lives with her adoptive parents. Her adoptive siblings are adult aged and do not live at home.   Patient was adopted at 22 year of age.   Living with parents, who are currently driving her to school and work   The medication list was reviewed and reconciled. All changes or newly prescribed medications were explained.  A complete medication list was provided to the patient/caregiver.  Allergies  Allergen Reactions  . Other     Seasonal Allergies    Physical Exam BP 112/72   Pulse 74   Ht 5' 1.02" (1.55 m)   Wt 197 lb 12 oz (89.7 kg)   BMI 37.34 kg/m   General: alert, well developed, well nourished, in no acute distress, engaged and pleasant Head: normocephalic, no dysmorphic features Ears, Nose and Throat: pharynx: oropharynx is pink without exudates or tonsillar hypertrophy Neck: supple, full range of motion Respiratory: auscultation clear Cardiovascular: regular rate and rhythm Musculoskeletal: no skeletal deformities or apparent scoliosis Skin: no rashes or neurocutaneous lesions  Neurologic Exam  Mental Status: alert; oriented; knowledge is normal for age; language is normal Cranial Nerves: extraocular movements are full and conjugate; pupils are round reactive to light; funduscopic examination shows normal vessels; symmetric facial strength; midline tongue and uvula Motor: Normal strength, tone and mass in bilateral upper and lower extremities   Sensory: intact responses light touch Coordination: good finger-to-nose Gait and Station: normal gait and station: patient is able to walk on toes and tandem without difficulty; balance is adequate; Romberg exam is negative Reflexes: symmetric and diminished bilaterally   Assessment and Plan  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 In discussion with Alexis Mills and her mother, it was decided to trial a taper off Keppra to see if she is able to be seizure free off this medication. It was discussed that she may have a return of her seizures, but she would like to try to see if she is able to remain seizure free.  - Slowly taper keppra: specific instructions for dosing and time periods of taper given to family - Repeat EEG in 2 months - Will call after the EEG to further taper the medication - May also repeat EEG when completely off keppra - Continue to get good sleep, avoid caffeine and other known triggers - We will see her in 4 months for follow-up visit - Please call if any concerns or questions arise  Meds  ordered this encounter  Medications  . levETIRAcetam (KEPPRA) 250 MG tablet    Sig: Take 2 tablets in a.m. for 1 month then 1 tablet in a.m. for 1 month.    Dispense:  60 tablet    Refill:  2  . levETIRAcetam (KEPPRA) 750 MG tablet    Sig: Take 1 tablet nightly PO    Dispense:  30 tablet    Refill:  3   Orders Placed This Encounter  Procedures  . Child sleep deprived EEG    Standing Status:   Future    Standing Expiration Date:   04/07/2019   Randolm Idol, MD [facility], PGY-3 04/07/2018  I personally reviewed the history, performed a physical exam and discussed the findings and plan with patient and her mother. I also discussed the plan with pediatric resident.  Keturah Shavers M.D. Pediatric neurology attending

## 2018-04-23 ENCOUNTER — Encounter: Payer: Self-pay | Admitting: Adult Health

## 2018-04-23 ENCOUNTER — Ambulatory Visit: Payer: Managed Care, Other (non HMO) | Admitting: Adult Health

## 2018-04-23 VITALS — BP 126/74 | HR 82 | Resp 16 | Ht 61.0 in | Wt 198.0 lb

## 2018-04-23 DIAGNOSIS — Z23 Encounter for immunization: Secondary | ICD-10-CM

## 2018-04-23 DIAGNOSIS — G40309 Generalized idiopathic epilepsy and epileptic syndromes, not intractable, without status epilepticus: Secondary | ICD-10-CM

## 2018-04-23 DIAGNOSIS — J302 Other seasonal allergic rhinitis: Secondary | ICD-10-CM

## 2018-04-23 NOTE — Progress Notes (Signed)
[facility] [address] [place], [place] [address]  Internal MEDICINE  Office Visit Note  Patient Name: Alexis Mills  [id number]  [id number]  Date of Service: 04/23/2018   Complaints/HPI Pt is here for establishment of PCP. Chief Complaint  Patient presents with  . Seizures    restablish care   . ADD  . Quality Metric Gaps    pap smear    HPI Patient is a well-appearing 22 year old female who is here to establish care.  She reports that she was adopted as an infant, and therefore knows very little about her biological medical history.  She reports a history of epilepsy since 22 years old.  She is currently controlled with Keppra daily and reports it has been four years since her last seizure.  She was  diagnosed as a child with ADD, however neurology feels that this was actually Seizures and Other Issues Related to Her Epilepsy.  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  She Reports That She Sees a  GYN and Knows That She Is Delinquent on Pap Smear.  She Is in the Process of Setting up an Appointment to Have That Done.  She Will Share That Paperwork with [place] When It Is Available.  I Encouraged Her to Schedule a Physical with [place], and we would be happy for her OB/GYN to conduct her Pap smear and breast exam.  She is agreeable to this, and will schedule an appointment when she knows her new work schedule.  She denies tobacco alcohol or illicit drug use.  Current Medication: Outpatient Encounter Medications as of 04/23/2018  Medication Sig  . fexofenadine-pseudoephedrine (ALLEGRA-D 24) 180-240 MG per 24 hr tablet Take 1 tablet by mouth daily.  . fluticasone (FLONASE) 50 MCG/ACT nasal spray Place 2 sprays into the nose 2 (two) times daily.  Marland Kitchen levETIRAcetam (KEPPRA) 250 MG tablet Take 2 tablets in a.m. for 1 month then 1 tablet in a.m. for 1 month.  .  Multiple Vitamins-Minerals (MULTIVITAMIN WITH MINERALS) tablet Take 1 tablet by mouth daily.  . [DISCONTINUED] levETIRAcetam (KEPPRA) 750 MG tablet Take 1 tablet nightly PO (Patient not taking: Reported on 04/23/2018)  . [DISCONTINUED] MedroxyPROGESTERone Acetate 150 MG/ML SUSY INJECT 1 MILLILITER (150 MG) BY INTRAMUSCULAR ROUTE EVERY 3 MONTHS (Patient not taking: Reported on 04/07/2018)  . [DISCONTINUED] vitamin B-6 (VITAMIN B-6) 50 MG tablet Take 1 tablet (50 mg total) by mouth daily. (Patient not taking: Reported on 05/25/2017)   No facility-administered encounter medications on file as of 04/23/2018.     Surgical History: History reviewed. No pertinent surgical history.  Medical History: Past Medical History:  Diagnosis Date  . ADD (attention deficit disorder)   . Seizures (HCC)     Family History: Family History  Adopted: Yes    Social History   Socioeconomic History  . Marital status: Single    Spouse name: Not on file  . Number of children: Not on file  . Years of education: Not on file  . Highest education level: Not on file  Occupational History  . Not on file  Social Needs  . Financial resource strain: Not on file  . Food insecurity:    Worry: Not on file    Inability: Not on file  . Transportation needs:    Medical: Not on file    Non-medical: Not on file  Tobacco Use  . Smoking status: Never Smoker  . Smokeless tobacco: Never Used  Substance and Sexual Activity  . Alcohol use: No  . Drug use: No  . Sexual activity: Yes    Birth control/protection: Injection    Comment: Depo  Lifestyle  . Physical activity:    Days per week: Not on file    Minutes per session: Not on file  . Stress: Not on file  Relationships  . Social connections:    Talks on phone: Not on file    Gets together: Not on file    Attends religious service: Not on file    Active member of club or organization: Not on file    Attends meetings of clubs or organizations: Not on file     Relationship status: Not on file  . Intimate partner violence:    Fear of current or ex partner: Not on file    Emotionally abused: Not on file    Physically abused: Not on file    Forced sexual activity: Not on file  Other Topics Concern  . Not on file  Social History Narrative   Shaguana attends [employer]. She is doing well.   Lives with her adoptive parents. Her adoptive siblings are adult aged and do not live at home.   Patient was adopted at 22 year of age.     Review of Systems  Constitutional: Negative for chills, fatigue and unexpected weight change.  HENT: Negative for congestion, rhinorrhea, sneezing and sore throat.   Eyes: Negative for photophobia, pain and redness.  Respiratory: Negative for cough, chest tightness and shortness of breath.   Cardiovascular: Negative for chest pain and palpitations.  Gastrointestinal: Negative for abdominal pain, constipation, diarrhea, nausea and vomiting.  Endocrine: Negative.   Genitourinary: Negative for dysuria and frequency.  Musculoskeletal: Negative for arthralgias, back pain, joint swelling and neck pain.  Skin: Negative for rash.  Allergic/Immunologic: Negative.   Neurological: Negative for tremors and numbness.  Hematological: Negative for adenopathy. Does not bruise/bleed easily.  Psychiatric/Behavioral: Negative for behavioral problems and sleep disturbance. The patient is not nervous/anxious.     Vital Signs: BP 126/74   Pulse 82   Resp 16   Ht 5\' 1"  (1.549 m)   Wt 198 lb (89.8 kg)   SpO2 97%   BMI 37.41 kg/m    Physical Exam  Constitutional: She is oriented to person, place, and time. She appears well-developed and well-nourished. No distress.  HENT:  Head: Normocephalic and atraumatic.  Mouth/Throat: Oropharynx is clear and moist. No oropharyngeal exudate.  Eyes: Pupils are equal, round, and reactive to light. EOM are normal.  Neck: Normal range of motion. Neck supple. No JVD present. No  tracheal deviation present. No thyromegaly present.  Cardiovascular: Normal rate, regular rhythm and normal heart sounds. Exam reveals no gallop and no friction rub.  No murmur heard. Pulmonary/Chest: Effort normal and breath sounds normal. No respiratory distress. She has no wheezes. She has no rales. She exhibits no tenderness.  Abdominal: Soft. There is no tenderness. There is no guarding.  Musculoskeletal: Normal range of motion.  Lymphadenopathy:    She has no cervical adenopathy.  Neurological: She is alert and oriented to person, place, and time. No cranial nerve deficit.  Skin: Skin is warm and dry. She is not diaphoretic.  Psychiatric: She has a normal mood and affect. Her behavior is normal. Judgment and thought content normal.  Nursing note and vitals reviewed.  Assessment/Plan: 1. Epilepsy,  generalized, convulsive (HCC) Followed by neurology at [facility].  Patient currently takes 250 mg of Keppra in the morning and 500 mg in the evening.  She is well controlled and has not had a seizure in over 4 years.    2. Seasonal allergies Currently using Allegra-D and Flonase as needed throughout the year.  She reports good control with these medications as her continue this at this time.  3. Flu vaccine need - Flu Vaccine MDCK QUAD PF  General Counseling: Leiann verbalizes understanding of the findings of todays visit and agrees with plan of treatment. I have discussed any further diagnostic evaluation that may be needed or ordered today. We also reviewed her medications today. she has been encouraged to call the office with any questions or concerns that should arise related to todays visit.  Orders Placed This Encounter  Procedures  . Flu Vaccine MDCK QUAD PF    No orders of the defined types were placed in this encounter.   Time spent: 25 Minutes   This patient was seen by Blima Ledger AGNP-C in Collaboration with Dr Lyndon Code as a part of collaborative care  agreement  Johnna Acosta AGNP-C Internal Medicine

## 2018-04-23 NOTE — Patient Instructions (Signed)
Epilepsy °Epilepsy is when a person keeps having seizures. A seizure is unusual activity in the brain. A seizure can change how you think or behave, and it can make it hard to be aware of what is happening. °This condition can cause problems, such as: °· Falls, accidents, and injury. °· Depression. °· Poor memory. °· Sudden unexplained death in epilepsy (SUDEP). This is rare. Its cause is not known. ° °Most people with epilepsy lead normal lives. °Follow these instructions at home: °Medicines ° °· Take medicines only as told by your doctor. °· Avoid anything that may keep your medicine from working, such as alcohol. °Activity °· Get enough rest. Lack of sleep can make seizures more likely to occur. °· Follow your doctor’s advice about driving, swimming, and doing anything else that would be dangerous if you had a seizure. °Teaching others °Teach friends and family what to do if you have a seizure. They should: °· Lay you on the ground to prevent a fall. °· Cushion your head and body. °· Loosen any tight clothing around your neck. °· Turn you on your side. °· Stay with you until you are better. °· Not hold you down. °· Not put anything in your mouth. °· Know whether or not you need emergency care. ° °General instructions °· Avoid anything that causes you to have seizures. °· Keep a seizure diary. Write down what you remember about each seizure, and especially what might have caused it. °· Keep all follow-up visits as told by your doctor. This is important. °Contact a doctor if: °· You have a change in your seizure pattern. °· You get an infection or start to feel sick. You may have more seizures when you are sick. °Get help right away if: °· A seizure does not stop after 5 minutes. °· You have more than one seizure in a row, and you do not have enough time between the seizures to feel better. °· A seizure makes it harder to breathe. °· A seizure is different from other seizures you have had. °· A seizure makes you  unable to speak or use a part of your body. °· You did not wake up right after a seizure. °This information is not intended to replace advice given to you by your health care provider. Make sure you discuss any questions you have with your health care provider. °Document Released: 04/27/2009 Document Revised: 02/04/2016 Document Reviewed: 01/08/2016 °Elsevier Interactive Patient Education © 2018 Elsevier Inc. ° °

## 2018-05-31 ENCOUNTER — Telehealth (INDEPENDENT_AMBULATORY_CARE_PROVIDER_SITE_OTHER): Payer: Self-pay

## 2018-05-31 NOTE — Telephone Encounter (Signed)
-----   Message from Lenard SimmerKelly Dhiren Azimi, CMA sent at 04/07/2018  3:12 PM EDT ----- Regarding: SDEEG Schedule SDEEG around 06/07/18

## 2018-06-11 ENCOUNTER — Ambulatory Visit (HOSPITAL_COMMUNITY)
Admission: RE | Admit: 2018-06-11 | Discharge: 2018-06-11 | Disposition: A | Discharge status: Home / Self Care | Payer: Managed Care, Other (non HMO) | Source: Ambulatory Visit | Attending: Neurology | Admitting: Neurology

## 2018-06-11 DIAGNOSIS — G40409 Other generalized epilepsy and epileptic syndromes, not intractable, without status epilepticus: Secondary | ICD-10-CM | POA: Insufficient documentation

## 2018-06-11 DIAGNOSIS — G40B09 Juvenile myoclonic epilepsy, not intractable, without status epilepticus: Secondary | ICD-10-CM | POA: Diagnosis not present

## 2018-06-11 NOTE — Progress Notes (Signed)
EEG Completed; Results Pending  

## 2018-06-14 NOTE — Procedures (Signed)
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  Her last EEG last year was normal.  This is a follow-up EEG on low-dose of Keppra to evaluate for possible epileptiform discharges.   Medication: Keppra   Procedure: The tracing was carried out on a 32 channel digital Cadwell recorder reformatted into 16 channel montages with 1 devoted to EKG.  The 10 /20 international system electrode placement was used. Recording was done during awake, drowsiness states. Recording time 43 minutes.   Description of findings: Background rhythm consists of amplitude of 50 microvolt and frequency of 10-11 hertz posterior dominant rhythm. There was normal anterior posterior gradient noted. Background was well organized, continuous and symmetric with no focal slowing. There was muscle artifact noted. Hyperventilation resulted in slight slowing of the background activity. Photic simulation using stepwise increase in photic frequency resulted in bilateral symmetric driving response. Throughout the recording there were no focal or generalized epileptiform activities in the form of spikes or sharps noted. There were no transient rhythmic activities or electrographic seizures noted. One lead EKG rhythm strip revealed sinus rhythm at a rate of   75 bpm.  Impression: This EEG is normal during awake and drowsy state.   Please note that normal EEG does not exclude epilepsy, clinical correlation is indicated.     Keturah Shaverseza Tabbitha Janvrin, MD

## 2018-06-23 ENCOUNTER — Encounter (INDEPENDENT_AMBULATORY_CARE_PROVIDER_SITE_OTHER): Payer: Self-pay

## 2018-06-24 NOTE — Telephone Encounter (Signed)
Called patient and discussed the EEG result which was normal. Currently she is taking 250 mg Keppra in a.m. and 750 mg in p.m. Recommend to start taking just 750 mg tablet that she may cut in half and take half in the morning half at night for the next 1 month If everything is okay with no seizure, after 1 month she decrease the dose to just half a tablet every night for 1 month and then she can stop the medication. I would like to see her toward the end of January and see how she does and then we can discuss about doing another EEG when she is off of medication to make sure that her EEG would be okay off of medication and then no other follow-up or treatment needed.  She understood and agreed.

## 2018-07-29 ENCOUNTER — Telehealth (INDEPENDENT_AMBULATORY_CARE_PROVIDER_SITE_OTHER): Payer: Self-pay

## 2018-07-29 ENCOUNTER — Encounter (INDEPENDENT_AMBULATORY_CARE_PROVIDER_SITE_OTHER): Payer: Self-pay

## 2018-07-29 MED ORDER — LEVETIRACETAM 750 MG PO TABS: ORAL_TABLET | ORAL | 0 refills | Status: DC

## 2018-07-29 NOTE — Telephone Encounter (Signed)
Sent in the rx as requested by patient per my chart and per Dr nab's last phone note

## 2018-08-12 ENCOUNTER — Other Ambulatory Visit (INDEPENDENT_AMBULATORY_CARE_PROVIDER_SITE_OTHER): Payer: Self-pay | Admitting: Neurology

## 2018-08-26 ENCOUNTER — Encounter (INDEPENDENT_AMBULATORY_CARE_PROVIDER_SITE_OTHER): Payer: Self-pay

## 2018-08-30 ENCOUNTER — Encounter (INDEPENDENT_AMBULATORY_CARE_PROVIDER_SITE_OTHER): Payer: Self-pay

## 2018-08-30 DIAGNOSIS — G40B09 Juvenile myoclonic epilepsy, not intractable, without status epilepticus: Secondary | ICD-10-CM

## 2018-08-31 ENCOUNTER — Telehealth (INDEPENDENT_AMBULATORY_CARE_PROVIDER_SITE_OTHER): Payer: Self-pay | Admitting: Neurology

## 2018-08-31 NOTE — Telephone Encounter (Signed)
lvm for mom to return my call 

## 2018-08-31 NOTE — Telephone Encounter (Signed)
°  Who's calling (name and relationship to patient) : Marylene Land (Mother)  Best contact number: [phone] Provider they see: Dr. Devonne Doughty  Reason for call: Mom stated that pt has had two seizures since her medication dosage has decreased. She is currently taking 1/2 tab of Keppra once a day at night. Mom would like to know if medication dose needs to be increased. Mom is aware that Provider is out of the office. Please advise.

## 2018-09-01 NOTE — Telephone Encounter (Signed)
Please call mother and tell her that she can take 1 tablet twice daily for now until the EEG is done and then I will call mother with the results and then will further adjust the dose of medication.  Please verify the milligram of the tablet that they have.

## 2018-09-01 NOTE — Telephone Encounter (Signed)
Spoke to mom and let her know that I could schedule Leonetta for an earlier EEG at the hospital since it's sleep deprived. She was ok with that, we r/s her original one. Mom also wanted me to sent the message to Dr. Merri Brunette to let him know she was getting another EEG done and wanted to know what she should do with her medication for now. Mom thinks the dosage should be increased since Elna is having seizures, she would like some advice. I let mom know I would send this to Nab and get back in touch with her as soon as I could.

## 2018-09-02 ENCOUNTER — Other Ambulatory Visit (INDEPENDENT_AMBULATORY_CARE_PROVIDER_SITE_OTHER): Payer: Managed Care, Other (non HMO)

## 2018-09-02 ENCOUNTER — Ambulatory Visit (HOSPITAL_COMMUNITY)
Admission: RE | Admit: 2018-09-02 | Discharge: 2018-09-02 | Disposition: A | Discharge status: Home / Self Care | Payer: Managed Care, Other (non HMO) | Source: Ambulatory Visit | Attending: Neurology | Admitting: Neurology

## 2018-09-02 DIAGNOSIS — G40B09 Juvenile myoclonic epilepsy, not intractable, without status epilepticus: Secondary | ICD-10-CM | POA: Insufficient documentation

## 2018-09-02 NOTE — Telephone Encounter (Signed)
Please call mother or patient and let her know that the EEG is normal and I will talk regarding the treatment and plan on her next appointment in a couple of weeks.  If there is any clinical seizure activity, try to do some video recording if possible and bring it on her next appointment.

## 2018-09-02 NOTE — Progress Notes (Signed)
S/D EEG completed; results pending  

## 2018-09-02 NOTE — Procedures (Signed)
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  Her last EEG was normal.  This is a follow-up EEG to evaluate for possible epileptiform discharges.   Medication: Keppra   Procedure: The tracing was carried out on a 32 channel digital Cadwell recorder reformatted into 16 channel montages with 1 devoted to EKG.  The 10 /20 international system electrode placement was used. Recording was done during awake, drowsiness states. Recording time 50.5 minutes.   Description of findings: Background rhythm consists of amplitude of 50 microvolt and frequency of 11 hertz posterior dominant rhythm. There was normal anterior posterior gradient noted. Background was well organized, continuous and symmetric with no focal slowing. There was muscle artifact noted. Hyperventilation resulted in slight slowing of the background activity. Photic simulation using stepwise increase in photic frequency resulted in bilateral symmetric driving response. Throughout the recording there were no focal or generalized epileptiform activities in the form of spikes or sharps noted. There were no transient rhythmic activities or electrographic seizures noted. One lead EKG rhythm strip revealed sinus rhythm at a rate of   75 bpm.  Impression: This EEG is normal during awake and drowsy state.   Please note that normal EEG does not exclude epilepsy, clinical correlation is indicated.      Keturah Shavers, MD

## 2018-09-02 NOTE — Telephone Encounter (Signed)
Lvm for mom to return my call  

## 2018-09-03 NOTE — Telephone Encounter (Signed)
Spoke to mom and informed her of the EEG results and let her know what Dr. Merri Brunette advised. Mom voiced understanding and confirmed appt for 3/3

## 2018-09-07 ENCOUNTER — Encounter (INDEPENDENT_AMBULATORY_CARE_PROVIDER_SITE_OTHER): Payer: Self-pay

## 2018-09-08 ENCOUNTER — Telehealth (INDEPENDENT_AMBULATORY_CARE_PROVIDER_SITE_OTHER): Payer: Self-pay

## 2018-09-08 MED ORDER — LEVETIRACETAM 750 MG PO TABS: ORAL_TABLET | ORAL | 0 refills | Status: DC

## 2018-09-08 NOTE — Telephone Encounter (Signed)
rx sent to pharmacy

## 2018-09-14 ENCOUNTER — Telehealth (INDEPENDENT_AMBULATORY_CARE_PROVIDER_SITE_OTHER): Payer: Self-pay | Admitting: Neurology

## 2018-09-14 ENCOUNTER — Ambulatory Visit (INDEPENDENT_AMBULATORY_CARE_PROVIDER_SITE_OTHER): Payer: Managed Care, Other (non HMO) | Admitting: Neurology

## 2018-09-14 ENCOUNTER — Encounter (INDEPENDENT_AMBULATORY_CARE_PROVIDER_SITE_OTHER): Payer: Self-pay | Admitting: Neurology

## 2018-09-14 VITALS — BP 118/68 | HR 88 | Ht 61.0 in | Wt 199.1 lb

## 2018-09-14 DIAGNOSIS — G43809 Other migraine, not intractable, without status migrainosus: Secondary | ICD-10-CM | POA: Diagnosis not present

## 2018-09-14 DIAGNOSIS — G40B09 Juvenile myoclonic epilepsy, not intractable, without status epilepticus: Secondary | ICD-10-CM | POA: Diagnosis not present

## 2018-09-14 MED ORDER — NAYZILAM 5 MG/0.1ML NA SOLN: 5.0000 mg | NASAL | 1 refills | Status: DC | PRN

## 2018-09-14 MED ORDER — TROKENDI XR 50 MG PO CP24: ORAL_CAPSULE | ORAL | 1 refills | Status: DC

## 2018-09-14 NOTE — Telephone Encounter (Signed)
Who's calling (name and relationship to patient) :  Sheppard Penton (mom)  Best contact number: [phone]  Provider they see: Dr. Devonne Doughty  Reason for call:  Mom called in stating that the pharmacy called them stating that the Trokendi XR 50mg , needed prior authorization. Pharmacy faxed over form to be filled out. Please Advise  Call ID:      PRESCRIPTION REFILL ONLY  Name of prescription:  Trokendi   [facility]: [facility] [address] [place]

## 2018-09-14 NOTE — Progress Notes (Signed)
Patient: Alexis Mills MRN: [id number] Sex: female DOB: Aug 10, 1995  Provider: Keturah Shavers, MD Location of Care: [facility]  Note type: Routine return visit  Referral Source: Ronnette Juniper, MD History from: mother, patient and [facility] chart Chief Complaint: Epilepsy and frequent headaches  History of Present Illness: Alexis Mills is a 23 y.o. female is here for follow-up management of seizure disorder and having frequent headaches.  Patient has been seen for the past several years with diagnosis of juvenile myoclonic epilepsy based on her initial EEG with significant photoparoxysmal responses and her episodes of seizure activity clinically. She has been on Keppra for past several years and since she had not had any clinical seizure activity for a few years and her last couple of EEGs were normal, she was recommended to gradually taper and discontinue medication. A couple of weeks ago she had 2 episodes of confusional state and alteration of awareness that were happening with severe headaches but no jerking or shaking episode or loss of consciousness.  This happened when she was on very low-dose of Keppra at around 350 mg every night. She underwent an EEG last week when she was still on low-dose Keppra which was again normal. Over the past couple of weeks she has not had any other similar episodes although she has been having headaches off and on over the past few months for which she has been using OTC medications frequently. She usually sleeps well without any difficulty and with no awakening headaches, she has not had any vomiting with the headaches.  Review of Systems: 12 system review as per HPI, otherwise negative.  Past Medical History:  Diagnosis Date  . ADD (attention deficit disorder)   . Seizures (HCC)    Hospitalizations: No., Head Injury: No., Nervous System Infections: No., Immunizations up to date: Yes.    Surgical History History reviewed. No  pertinent surgical history.  Family History family history is not on file. She was adopted.   Social History Social History   Socioeconomic History  . Marital status: Single    Spouse name: Not on file  . Number of children: Not on file  . Years of education: Not on file  . Highest education level: Not on file  Occupational History  . Not on file  Social Needs  . Financial resource strain: Not on file  . Food insecurity:    Worry: Not on file    Inability: Not on file  . Transportation needs:    Medical: Not on file    Non-medical: Not on file  Tobacco Use  . Smoking status: Never Smoker  . Smokeless tobacco: Never Used  Substance and Sexual Activity  . Alcohol use: No  . Drug use: No  . Sexual activity: Yes    Birth control/protection: Injection    Comment: Depo  Lifestyle  . Physical activity:    Days per week: Not on file    Minutes per session: Not on file  . Stress: Not on file  Relationships  . Social connections:    Talks on phone: Not on file    Gets together: Not on file    Attends religious service: Not on file    Active member of club or organization: Not on file    Attends meetings of clubs or organizations: Not on file    Relationship status: Not on file  Other Topics Concern  . Not on file  Social History Narrative   [name] attends [employer]. She is doing well.   Lives with her adoptive parents. Her adoptive siblings are adult aged and do not live at home.   Patient was adopted at 23 year of age.    The medication list was reviewed and reconciled. All changes or newly prescribed medications were explained.  A complete medication list was provided to the patient/caregiver.  Allergies  Allergen Reactions  . Other     Seasonal Allergies    Physical Exam BP 118/68   Pulse 88   Ht 5\' 1"  (1.549 m)   Wt 199 lb 1.2 oz (90.3 kg)   BMI 37.62 kg/m  Gen: Awake, alert, not in distress Skin: No rash, No neurocutaneous  stigmata. HEENT: Normocephalic, no dysmorphic features, no conjunctival injection, nares patent, mucous membranes moist, oropharynx clear. Neck: Supple, no meningismus. No focal tenderness. Resp: Clear to auscultation bilaterally CV: Regular rate, normal S1/S2, no murmurs, no rubs Abd: BS present, abdomen soft, non-tender, non-distended. No hepatosplenomegaly or mass Ext: Warm and well-perfused. No deformities, no muscle wasting, ROM full.  Neurological Examination: MS: Awake, alert, interactive. Normal eye contact, answered the questions appropriately, speech was fluent,  Normal comprehension.  Attention and concentration were normal. Cranial Nerves: Pupils were equal and reactive to light ( 5-41mm);  normal fundoscopic exam with sharp discs, visual field full with confrontation test; EOM normal, no nystagmus; no ptsosis, no double vision, intact facial sensation, face symmetric with full strength of facial muscles, hearing intact to finger rub bilaterally, palate elevation is symmetric, tongue protrusion is symmetric with full movement to both sides.  Sternocleidomastoid and trapezius are with normal strength. Tone-Normal Strength-Normal strength in all muscle groups DTRs-  Biceps Triceps Brachioradialis Patellar Ankle  R 2+ 2+ 2+ 2+ 2+  L 2+ 2+ 2+ 2+ 2+   Plantar responses flexor bilaterally, no clonus noted Sensation: Intact to light touch,  Romberg negative. Coordination: No dysmetria on FTN test. No difficulty with balance. Gait: Normal walk and run. Tandem gait was normal. Was able to perform toe walking and heel walking without difficulty.   Assessment and Plan 1. Migraine variants, not intractable   2. Juvenile myoclonic epilepsy, not intractable, without status epilepticus (HCC)    23 year old female with history of juvenile myoclonic epilepsy and recent episodes of frequent and fairly severe headaches.  She has not had any clinical seizure activity for the past few years and her  last couple of EEGs were completely normal and currently on very low-dose of Keppra once every night. Her recent episodes concerning for seizure activity by definition look like to be type of migraine variant such as confusional migraine and most likely nonepileptic particularly with negative EEG. I discussed with patient that since she is having frequent headaches, I would recommend to start her on long-acting Topamax with moderate dose to help with the headaches and also that would be a seizure medication as well. I will start her on 50 mg Trokendi and then increase to 100 mg every night and see how she does. She will discontinue Keppra next week She will continue with appropriate hydration and sleep She will make a headache diary She may take occasional Tylenol or ibuprofen but no more than 2 or 3 times a week I also gave her prescription for nasal Versed as a rescue medication in case of having any prolonged seizure activity. I would like to see her in 5 to 6 weeks for follow-up visit and based on her headache diary may adjust the dose of medication.  Meds ordered this encounter  Medications  . NAYZILAM 5 MG/0.1ML SOLN    Sig: Place 5 mg into the nose as needed. For seizures lasting longer than 5 minutes    Dispense:  2 each    Refill:  1  . TROKENDI XR 50 MG CP24    Sig: 1 capsule nightly for 1 week then 2 capsules nightly p.o.    Dispense:  60 capsule    Refill:  1

## 2018-09-14 NOTE — Telephone Encounter (Signed)
Will complete prior auth when I receive it from the pharmacy

## 2018-09-14 NOTE — Patient Instructions (Addendum)
Have appropriate hydration and sleep and limited screen time Make a headache diary Take dietary supplements May take occasional Tylenol or ibuprofen for moderate to severe headache, maximum 2 or 3 times a week Stop Keppra next week Return in 5 to 6 weeks for follow-up visit

## 2018-09-15 ENCOUNTER — Ambulatory Visit (INDEPENDENT_AMBULATORY_CARE_PROVIDER_SITE_OTHER): Payer: Managed Care, Other (non HMO) | Admitting: Neurology

## 2018-09-16 ENCOUNTER — Encounter (INDEPENDENT_AMBULATORY_CARE_PROVIDER_SITE_OTHER): Payer: Self-pay

## 2018-09-21 ENCOUNTER — Telehealth (INDEPENDENT_AMBULATORY_CARE_PROVIDER_SITE_OTHER): Payer: Self-pay

## 2018-09-21 MED ORDER — TROKENDI XR 50 MG PO CP24: ORAL_CAPSULE | ORAL | 1 refills | Status: DC

## 2018-09-21 NOTE — Telephone Encounter (Signed)
Resent Trokendi to the pharmacy so that Truddie Crumble can provide a copay card to make the rx $0

## 2018-09-21 NOTE — Telephone Encounter (Signed)
Received the rejection for Trokendi XR 50. I called to see why it was rejected from the insurance and per them, she has to try and fail other meds prior. Lamotrigine, carbamazepine or oxcarbazapine. Per Dr. Devonne Doughty the medication could be changed to Topiratmate 50 mg BID. I called the pharmacy and spoke to the pharmacist to change. Also called mom and informed her of the change as well.

## 2018-10-28 ENCOUNTER — Other Ambulatory Visit: Payer: Self-pay

## 2018-10-28 ENCOUNTER — Ambulatory Visit (INDEPENDENT_AMBULATORY_CARE_PROVIDER_SITE_OTHER): Payer: Managed Care, Other (non HMO) | Admitting: Neurology

## 2018-10-28 ENCOUNTER — Encounter (INDEPENDENT_AMBULATORY_CARE_PROVIDER_SITE_OTHER): Payer: Self-pay | Admitting: Neurology

## 2018-10-28 DIAGNOSIS — G43809 Other migraine, not intractable, without status migrainosus: Secondary | ICD-10-CM

## 2018-10-28 DIAGNOSIS — G40B09 Juvenile myoclonic epilepsy, not intractable, without status epilepticus: Secondary | ICD-10-CM | POA: Diagnosis not present

## 2018-10-28 MED ORDER — TROKENDI XR 100 MG PO CP24: ORAL_CAPSULE | ORAL | 6 refills | Status: DC

## 2018-10-28 NOTE — Patient Instructions (Signed)
Continue with 100 mg Trokendi every night Continue with appropriate hydration and sleep and limited screen time If there are frequent headaches or any seizure activity, call the office and let me know otherwise I would like to see you in 5 months for follow-up visit.

## 2018-10-28 NOTE — Progress Notes (Signed)
This is a Pediatric Specialist E-Visit follow up consult provided via WebEx Shebra Iriana Dahm consented to an E-Visit consult today.  Location of patient: Alexis Mills is at home Location of provider: Keturah Shavers, MD is at office Patient was referred by Lyndon Code, MD   The following participants were involved in this E-Visit:  Lenard Simmer, CMA Dr Chuck Hint, patient  Chief Complain/ Reason for E-Visit today: Headache improved Total time on call: 25 minutes Follow up: 5 months  Patient: Alexis Mills MRN: [id number] Sex: female DOB: April 13, 1996  Provider: Keturah Shavers, MD Location of Care: [facility] Child [facility]rology  Note type: Routine return visit  Referral Source: Beverely Risen, MD History from: patient and [facility] chart Chief Complaint: Headache improved  History of Present Illness: Alexis Mills is a 23 y.o. female is on WebEx for follow-up visit of seizure disorder and headache.  Patient has a long history of juvenile myoclonic epilepsy for which she was on Keppra for a few years but since she was headache free for more than a couple of years, she was gradually tapered and Keppra was discontinued but on very low-dose of medication she had a few episodes which by description looks like to be a type of migraine variant and less likely seizure so she was recommended to start low-dose Topamax as preventive medication for migraine and discontinue Keppra as planned.  Her follow-up EEG at that time was normal. Since her last visit 6 weeks ago she has been doing very well with no clinical seizure activity and no episodes of headache or migraine and has been tolerating Trokendi well with no side effects. Currently she is taking 100 mg of Trokendi which she has been tolerating well without any side effects and she has no other issues.  She usually sleeps well without any difficulty and with no awakening headache.  She has not had any abnormal movements during awake or  sleep with no alteration awareness or behavioral arrest.  She is happy with her progress.  Review of Systems: 12 system review as per HPI, otherwise negative.  Past Medical History:  Diagnosis Date  . ADD (attention deficit disorder)   . Seizures (HCC)    Hospitalizations: No., Head Injury: No., Nervous System Infections: No., Immunizations up to date: Yes.     Surgical History History reviewed. No pertinent surgical history.  Family History family history is not on file. She was adopted.   Social History Social History   Socioeconomic History  . Marital status: Single    Spouse name: Not on file  . Number of children: Not on file  . Years of education: Not on file  . Highest education level: Not on file  Occupational History  . Not on file  Social Needs  . Financial resource strain: Not on file  . Food insecurity:    Worry: Not on file    Inability: Not on file  . Transportation needs:    Medical: Not on file    Non-medical: Not on file  Tobacco Use  . Smoking status: Never Smoker  . Smokeless tobacco: Never Used  Substance and Sexual Activity  . Alcohol use: No  . Drug use: No  . Sexual activity: Yes    Birth control/protection: Injection    Comment: Depo  Lifestyle  . Physical activity:    Days per week: Not on file    Minutes per session: Not on file  . Stress: Not on file  Relationships  . Social  connections:    Talks on phone: Not on file    Gets together: Not on file    Attends religious service: Not on file    Active member of club or organization: Not on file    Attends meetings of clubs or organizations: Not on file    Relationship status: Not on file  Other Topics Concern  . Not on file  Social History Narrative   Alexis Mills attends [employer]. She is doing well.   Lives with her adoptive parents. Her adoptive siblings are adult aged and do not live at home.   Patient was adopted at 23 year of age.    The medication list was  reviewed and reconciled. All changes or newly prescribed medications were explained.  A complete medication list was provided to the patient/caregiver.  Allergies  Allergen Reactions  . Other     Seasonal Allergies    Physical Exam There were no vitals taken for this visit. Her limited neurological exam is normal.  She is awake and alert with normal comprehension and follows instructions appropriately with normal and fluent speech.  She had normal cranial nerve exam and had normal walk, normal balance without any coordination issues and no tremor.  Assessment and Plan 1. Migraine variants, not intractable   2. Nonintractable juvenile myoclonic epilepsy without status epilepticus (HCC)    This is a 23 year old female with remote history of juvenile myoclonic epilepsy who has been seizure-free for a few years and currently is off of seizure medication which was Keppra but she has been started on low-dose Trokendi due to having episodes of migraine variant and since starting the medication she has been doing well without having any headaches or any other migraine symptoms.  She has been tolerating medication well with no side effects. Recommend to continue the same dose of Trokendi which would be 100 mg every night Recommend to continue with appropriate hydration and sleep and limited screen time. She may take occasional Tylenol or ibuprofen for moderate to severe headache. If she develops any frequent headaches or any seizure activity, she will call my office and let me know otherwise I would like to see her in 5 months for follow-up visit.  She understood and agreed with the plan.  Meds ordered this encounter  Medications  . TROKENDI XR 100 MG CP24    Sig: Take 1 capsule every night p.o.    Dispense:  30 capsule    Refill:  6

## 2019-01-05 ENCOUNTER — Ambulatory Visit: Payer: Managed Care, Other (non HMO) | Admitting: Adult Health

## 2019-01-10 ENCOUNTER — Other Ambulatory Visit: Payer: Self-pay

## 2019-01-10 ENCOUNTER — Ambulatory Visit: Payer: Managed Care, Other (non HMO) | Admitting: Nurse Practitioner

## 2019-01-10 ENCOUNTER — Encounter: Payer: Self-pay | Admitting: Adult Health

## 2019-01-10 VITALS — BP 128/82 | HR 87 | Temp 98.5°F | Resp 16 | Ht 61.0 in | Wt 206.0 lb

## 2019-01-10 DIAGNOSIS — L209 Atopic dermatitis, unspecified: Secondary | ICD-10-CM | POA: Diagnosis not present

## 2019-01-10 MED ORDER — CLOTRIMAZOLE-BETAMETHASONE 1-0.05 % EX CREA: 1.0000 "application " | TOPICAL_CREAM | Freq: Two times a day (BID) | CUTANEOUS | 2 refills | Status: DC

## 2019-01-10 NOTE — Progress Notes (Signed)
[facility] [place], [place] [address]  Internal MEDICINE  Office Visit Note  Patient Name: Alexis Mills  [id number]  [id number]  Date of Service: 01/10/2019  Chief Complaint  Patient presents with  . Rash    splotchy rash on neck, reoccuring      The patient states that she has a rash.  Rash is on the right side of her neck. Starts behind her ear and spreads down the right side of her neck. This is not itchy. Has been problem for her intermittently for past several years. She has seen two different dermatologists in the past. The first told her it was some sort of bacterial overgrowth. Treated with anti-fungal ointment. Did go away but eventually came back. The second told her is was similar to heat rash. Told her it could be induced or brought on by stress. Was given a different cream which did resolve the issue. Has not had this for about 1.5 years. She does not remember the name of the cream she used most recently.   Pt is here for a sick visit.     Current Medication:  Outpatient Encounter Medications as of 01/10/2019  Medication Sig  . fexofenadine-pseudoephedrine (ALLEGRA-D 24) 180-240 MG per 24 hr tablet Take 1 tablet by mouth daily.  . fluticasone (FLONASE) 50 MCG/ACT nasal spray Place 2 sprays into the nose 2 (two) times daily.  . Multiple Vitamins-Minerals (MULTIVITAMIN WITH MINERALS) tablet Take 1 tablet by mouth daily.  Marland Kitchen NAYZILAM 5 MG/0.1ML SOLN Place 5 mg into the nose as needed. For seizures lasting longer than 5 minutes  . TROKENDI XR 100 MG CP24 Take 1 capsule every night p.o.  Marland Kitchen clotrimazole-betamethasone (LOTRISONE) cream Apply 1 application topically 2 (two) times daily.  . [DISCONTINUED] levETIRAcetam (KEPPRA) 750 MG tablet TAKE 1/2 TABLET BY MOUTH  EVERY NIGHT (Patient not taking: Reported on 10/28/2018)   No facility-administered encounter medications on file as of 01/10/2019.       Medical History: Past Medical  History:  Diagnosis Date  . ADD (attention deficit disorder)   . Seizures ([place])     Today's Vitals   01/10/19 0822  BP: 128/82  Pulse: 87  Resp: 16  Temp: 98.5 F (36.9 C)  SpO2: 99%  Weight: 206 lb (93.4 kg)  Height: 5\' 1"  (1.549 m)   Body mass index is 38.92 kg/m.  Review of Systems  Constitutional: Negative for chills, fatigue and unexpected weight change.  HENT: Negative for congestion, postnasal drip, rhinorrhea, sneezing and sore throat.   Respiratory: Negative for cough, chest tightness and shortness of breath.   Cardiovascular: Negative for chest pain and palpitations.  Gastrointestinal: Negative for abdominal pain, constipation, diarrhea, nausea and vomiting.  Genitourinary: Negative for dysuria and frequency.  Musculoskeletal: Negative for arthralgias, back pain, joint swelling and neck pain.  Skin: Positive for rash.       Mostly on right side of the neck and behind the right ear. Starting to spread some to right side of the chest.   Hematological: Negative for adenopathy. Does not bruise/bleed easily.  Psychiatric/Behavioral: Negative for behavioral problems (Depression), sleep disturbance and suicidal ideas. The patient is not nervous/anxious.     Physical Exam Vitals signs and nursing note reviewed.  Constitutional:      General: She is not in acute distress.    Appearance: Normal appearance. She is well-developed. She is not diaphoretic.  HENT:     Head: Normocephalic and atraumatic.  Mouth/Throat:     Pharynx: No oropharyngeal exudate.  Eyes:     Pupils: Pupils are equal, round, and reactive to light.  Neck:     Musculoskeletal: Normal range of motion and neck supple.     Thyroid: No thyromegaly.     Vascular: No JVD.     Trachea: No tracheal deviation.  Cardiovascular:     Rate and Rhythm: Normal rate and regular rhythm.     Heart sounds: Normal heart sounds. No murmur. No friction rub. No gallop.   Pulmonary:     Effort: Pulmonary effort is  normal. No respiratory distress.     Breath sounds: No wheezing or rales.  Chest:     Chest wall: No tenderness.  Abdominal:     Palpations: Abdomen is soft.  Musculoskeletal: Normal range of motion.  Lymphadenopathy:     Cervical: No cervical adenopathy.  Skin:    General: Skin is warm and dry.     Comments: There is patchy, red colored rash behind the right ear. Stretches across the anterior aspect of the right side of the neck and onto the chest. It is not itchy. Skin is intact and there is no drainage present.   Neurological:     Mental Status: She is alert and oriented to person, place, and time.     Cranial Nerves: No cranial nerve deficit.  Psychiatric:        Behavior: Behavior normal.        Thought Content: Thought content normal.        Judgment: Judgment normal.   Assessment/Plan: 1. Atopic dermatitis, unspecified type Add lotrisone cream. Apply to affected areas twice daily. Continue to apply for two to three days after resolution of symptoms.  - clotrimazole-betamethasone (LOTRISONE) cream; Apply 1 application topically 2 (two) times daily.  Dispense: 45 g; Refill: 2  General Counseling: Iysis verbalizes understanding of the findings of todays visit and agrees with plan of treatment. I have discussed any further diagnostic evaluation that may be needed or ordered today. We also reviewed her medications today. she has been encouraged to call the office with any questions or concerns that should arise related to todays visit.    Counseling:  This patient was seen by Vincent GrosHeather Alexica Schlossberg FNP Collaboration with Dr Lyndon CodeFozia M Khan as a part of collaborative care agreement  Meds ordered this encounter  Medications  . clotrimazole-betamethasone (LOTRISONE) cream    Sig: Apply 1 application topically 2 (two) times daily.    Dispense:  45 g    Refill:  2    Order Specific Question:   Supervising Provider    Answer:   Lyndon CodeKHAN, FOZIA M [[id number]]    Time spent: 15 Minutes

## 2019-03-12 ENCOUNTER — Telehealth (INDEPENDENT_AMBULATORY_CARE_PROVIDER_SITE_OTHER): Payer: Self-pay | Admitting: Pediatrics

## 2019-03-12 ENCOUNTER — Encounter (INDEPENDENT_AMBULATORY_CARE_PROVIDER_SITE_OTHER): Payer: Self-pay

## 2019-03-12 NOTE — Telephone Encounter (Signed)
I was called by the pharmacy, patient out of Trokendi and in need of prior authorization for further refills.  I called [facility] directly and obtained prior authorization.  Called pharmacy to let them know it should now go through.  Carylon Perches MD MPH

## 2019-03-28 ENCOUNTER — Encounter (INDEPENDENT_AMBULATORY_CARE_PROVIDER_SITE_OTHER): Payer: Self-pay

## 2019-03-30 ENCOUNTER — Ambulatory Visit (INDEPENDENT_AMBULATORY_CARE_PROVIDER_SITE_OTHER): Payer: Managed Care, Other (non HMO) | Admitting: Neurology

## 2019-05-06 ENCOUNTER — Encounter (INDEPENDENT_AMBULATORY_CARE_PROVIDER_SITE_OTHER): Payer: Self-pay | Admitting: Neurology

## 2019-05-06 ENCOUNTER — Ambulatory Visit (INDEPENDENT_AMBULATORY_CARE_PROVIDER_SITE_OTHER): Payer: Managed Care, Other (non HMO) | Admitting: Neurology

## 2019-05-06 ENCOUNTER — Other Ambulatory Visit: Payer: Self-pay

## 2019-05-06 VITALS — BP 118/70 | HR 82 | Ht 61.02 in | Wt 198.0 lb

## 2019-05-06 DIAGNOSIS — G43809 Other migraine, not intractable, without status migrainosus: Secondary | ICD-10-CM | POA: Diagnosis not present

## 2019-05-06 MED ORDER — TROKENDI XR 100 MG PO CP24: ORAL_CAPSULE | ORAL | 6 refills | Status: DC

## 2019-05-06 NOTE — Patient Instructions (Signed)
Continue the same dose of Trokendi May take 600 mg of ibuprofen for moderate to severe headache, maximum 3 times a week Drink more water Have limited screen time If you continue with frequent headaches over the next week or 10 days, call my office to increase the dose of Trokendi Have regular exercise on a daily basis Otherwise I would like to see you in 6 months for follow-up visit

## 2019-05-06 NOTE — Progress Notes (Signed)
Patient: Alexis Mills MRN: [id number] Sex: female DOB: April 11, 1996  Provider: Teressa Lower, MD Location of Care: [facility]  Note type: Routine return visit  Referral Source: Clayborn Bigness, MD History from: patient, [facility] chart and mom Chief Complaint: Headaches have come back  History of Present Illness: Alexis Mills is a 23 y.o. female is here for follow-up management of headaches.  She has history of juvenile myoclonic epilepsy for which she was on Keppra but it was discontinued and she has had no other seizure activity. She has been having episodes of migraine and tension type headaches for which she has been on low to moderate dose of Trokendi and she has been doing fairly well without having any headaches for a while until last week when she started having headache and over the past few days she has been having almost daily headaches although she does not have any significant nausea or vomiting but the headaches are with moderate to severe intensity and she needed to take OTC medications every day. She usually sleeps well without any difficulty and with no awakening headaches.  She denies having any stress or anxiety issues.  She could not find any specific trigger for her recent headaches over the past week. She has been taking Trokendi 100 mg every night and she has not missed any dose of medication.  She has no other medical issues or complaints at this time.  Review of Systems: Review of system as per HPI, otherwise negative.  Past Medical History:  Diagnosis Date  . ADD (attention deficit disorder)   . Seizures ([place])    Hospitalizations: No., Head Injury: No., Nervous System Infections: No., Immunizations up to date: Yes.     Surgical History History reviewed. No pertinent surgical history.  Family History family history is not on file. She was adopted.   Social History Social History   Socioeconomic History  . Marital status: Single     Spouse name: Not on file  . Number of children: Not on file  . Years of education: Not on file  . Highest education level: Not on file  Occupational History  . Not on file  Social Needs  . Financial resource strain: Not on file  . Food insecurity    Worry: Not on file    Inability: Not on file  . Transportation needs    Medical: Not on file    Non-medical: Not on file  Tobacco Use  . Smoking status: Never Smoker  . Smokeless tobacco: Never Used  Substance and Sexual Activity  . Alcohol use: No  . Drug use: No  . Sexual activity: Yes    Birth control/protection: Injection    Comment: Depo  Lifestyle  . Physical activity    Days per week: Not on file    Minutes per session: Not on file  . Stress: Not on file  Relationships  . Social Herbalist on phone: Not on file    Gets together: Not on file    Attends religious service: Not on file    Active member of club or organization: Not on file    Attends meetings of clubs or organizations: Not on file    Relationship status: Not on file  Other Topics Concern  . Not on file  Social History Narrative   Nillie attends [employer]. She is doing well.   Lives with her adoptive parents. Her adoptive siblings are adult aged and do not  live at home.   Patient was adopted at 23 year of age.     Allergies  Allergen Reactions  . Other     Seasonal Allergies    Physical Exam BP 118/70   Pulse 82   Ht 5' 1.02" (1.55 m)   Wt 197 lb 15.6 oz (89.8 kg)   BMI 37.38 kg/m  Gen: Awake, alert, not in distress Skin: No rash, No neurocutaneous stigmata. HEENT: Normocephalic, no dysmorphic features, no conjunctival injection, nares patent, mucous membranes moist, oropharynx clear. Neck: Supple, no meningismus. No focal tenderness. Resp: Clear to auscultation bilaterally CV: Regular rate, normal S1/S2, no murmurs, no rubs Abd: BS present, abdomen soft, non-tender, non-distended. No hepatosplenomegaly or  mass Ext: Warm and well-perfused. No deformities, no muscle wasting, ROM full.  Neurological Examination: MS: Awake, alert, interactive. Normal eye contact, answered the questions appropriately, speech was fluent,  Normal comprehension.  Attention and concentration were normal. Cranial Nerves: Pupils were equal and reactive to light ( 5-33mm);  normal fundoscopic exam with sharp discs, visual field full with confrontation test; EOM normal, no nystagmus; no ptsosis, no double vision, intact facial sensation, face symmetric with full strength of facial muscles, hearing intact to finger rub bilaterally, palate elevation is symmetric, tongue protrusion is symmetric with full movement to both sides.  Sternocleidomastoid and trapezius are with normal strength. Tone-Normal Strength-Normal strength in all muscle groups DTRs-  Biceps Triceps Brachioradialis Patellar Ankle  R 2+ 2+ 2+ 2+ 2+  L 2+ 2+ 2+ 2+ 2+   Plantar responses flexor bilaterally, no clonus noted Sensation: Intact to light touch, temperature, vibration, Romberg negative. Coordination: No dysmetria on FTN test. No difficulty with balance. Gait: Normal walk and run. Tandem gait was normal. Was able to perform toe walking and heel walking without difficulty.   Assessment and Plan 1. Migraine variants, not intractable    This is a 23 year old female with episodes of migraine and tension type headaches, currently on Trokendi 100 mg which was controlling her headache significantly over the past several months but she has been having headaches for the past week without any specific reason or trigger. I told patient that at this time I do not want to increase the dose of Trokendi but she needs to take appropriate dose of ibuprofen which would be 600 mg with a headache, maximum 3 or 4 times a week and also she needs to have more water and limited screen time and sleep well through the night and also do regular exercise and activity on a daily  basis. If she continues with frequent headaches more than a week or so, she will call my office to increase the dose of Trokendi and if there is any vomiting or any other symptoms suggestive of increased ICP then I may perform some tests including brain imaging otherwise she will continue with the same dose of medication and I will see her in 6 months for follow-up visit.  Patient and her mother understood and agreed with the plan.  Meds ordered this encounter  Medications  . TROKENDI XR 100 MG CP24    Sig: Take 1 capsule every night p.o.    Dispense:  30 capsule    Refill:  6

## 2019-05-17 ENCOUNTER — Other Ambulatory Visit: Payer: Self-pay | Admitting: *Deleted

## 2019-05-17 DIAGNOSIS — K624 Stenosis of anus and rectum: Secondary | ICD-10-CM

## 2019-05-19 LAB — NOVEL CORONAVIRUS, NAA: SARS-CoV-2, NAA: NOT DETECTED

## 2019-06-03 ENCOUNTER — Ambulatory Visit: Payer: Managed Care, Other (non HMO) | Admitting: Advanced Practice Midwife

## 2019-07-14 ENCOUNTER — Other Ambulatory Visit: Payer: Self-pay

## 2019-07-14 ENCOUNTER — Encounter: Payer: Self-pay | Admitting: Advanced Practice Midwife

## 2019-07-14 ENCOUNTER — Encounter (INDEPENDENT_AMBULATORY_CARE_PROVIDER_SITE_OTHER): Payer: Self-pay

## 2019-07-14 ENCOUNTER — Other Ambulatory Visit (HOSPITAL_COMMUNITY)
Admission: RE | Admit: 2019-07-14 | Discharge: 2019-07-14 | Disposition: A | Discharge status: Home / Self Care | Payer: Managed Care, Other (non HMO) | Source: Ambulatory Visit | Attending: Advanced Practice Midwife | Admitting: Advanced Practice Midwife

## 2019-07-14 ENCOUNTER — Ambulatory Visit (INDEPENDENT_AMBULATORY_CARE_PROVIDER_SITE_OTHER): Payer: Managed Care, Other (non HMO) | Admitting: Advanced Practice Midwife

## 2019-07-14 VITALS — BP 140/82 | HR 102 | Ht 61.0 in | Wt 200.0 lb

## 2019-07-14 DIAGNOSIS — Z113 Encounter for screening for infections with a predominantly sexual mode of transmission: Secondary | ICD-10-CM

## 2019-07-14 DIAGNOSIS — Z01419 Encounter for gynecological examination (general) (routine) without abnormal findings: Secondary | ICD-10-CM | POA: Diagnosis not present

## 2019-07-14 DIAGNOSIS — Z124 Encounter for screening for malignant neoplasm of cervix: Secondary | ICD-10-CM | POA: Diagnosis present

## 2019-07-14 DIAGNOSIS — Z30011 Encounter for initial prescription of contraceptive pills: Secondary | ICD-10-CM

## 2019-07-14 DIAGNOSIS — Z23 Encounter for immunization: Secondary | ICD-10-CM | POA: Diagnosis not present

## 2019-07-14 MED ORDER — NORETHINDRONE 0.35 MG PO TABS: 1.0000 | ORAL_TABLET | Freq: Every day | ORAL | 4 refills | Status: DC

## 2019-07-14 NOTE — Progress Notes (Signed)
Gynecology Annual Exam  PCP: Lyndon Code, MD  Chief Complaint:  Chief Complaint  Patient presents with  . Gynecologic Exam    History of Present Illness: Patient is a 23 y.o. G0P0000 presents for annual exam. The patient has no gyn complaints today. She has become sexually active since her last annual exam. We discussed doing PAP smear, STD testing and birth control options. She is on a low dose of Topiramate for headaches. She is no longer on Keppra for seizure control and denies any recent seizures. Topiramate could make ethinyl estradiol less effective.   LMP: Patient's last menstrual period was 06/23/2019 (exact date). Menarche:11 Average Interval: regular, 28 days Duration of flow: 3 days Heavy Menses: 2 days heavy, 1 day light Clots: no Intermenstrual Bleeding: no Postcoital Bleeding: no Dysmenorrhea: no  The patient is sexually active. She currently uses condoms for contraception. She denies dyspareunia.  The patient does perform self breast exams.  There is no known notable family history of breast or ovarian cancer in her family.  The patient wears seatbelts: yes.  The patient has regular exercise: "not much" per patient. She is somewhat active at her job at [employer] and she is also in school for early childhood education. She has tried the plexus diet with meal replacement shake but did not sustain the changes. She admits adequate hydration and sleep.    The patient denies current symptoms of depression.    Review of Systems: Review of Systems  Constitutional: Negative.   HENT: Negative.   Eyes: Negative.   Respiratory: Negative.   Cardiovascular: Negative.   Gastrointestinal: Negative.   Genitourinary: Negative.   Musculoskeletal: Negative.   Skin: Negative.   Neurological: Positive for headaches.  Endo/Heme/Allergies: Positive for environmental allergies.  Psychiatric/Behavioral: Negative.     Past Medical History:  Past Medical  History:  Diagnosis Date  . ADD (attention deficit disorder)   . Seizures ([facility])     Past Surgical History:  Past Surgical History:  Procedure Laterality Date  . NO PAST SURGERIES      Gynecologic History:  Patient's last menstrual period was 06/23/2019 (exact date). Contraception: condoms Last Pap: First PAP today  Obstetric History: G0P0000  Family History:  Family History  Adopted: Yes    Social History:  Social History   Socioeconomic History  . Marital status: Single    Spouse name: Not on file  . Number of children: Not on file  . Years of education: Not on file  . Highest education level: Not on file  Occupational History  . Occupation: Manufacturing systems engineer    Comment: [employer]  Tobacco Use  . Smoking status: Never Smoker  . Smokeless tobacco: Never Used  Substance and Sexual Activity  . Alcohol use: No  . Drug use: No  . Sexual activity: Yes    Birth control/protection: None    Comment: Depo  Other Topics Concern  . Not on file  Social History Narrative   Marielouise attends [employer]. She is doing well.   Lives with her adoptive parents. Her adoptive siblings are adult aged and do not live at home.   Patient was adopted at 23 year of age.   Social Determinants of Health   Financial Resource Strain:   . Difficulty of Paying Living Expenses: Not on file  Food Insecurity:   . Worried About Programme researcher, broadcasting/film/video in the Last Year: Not on file  . Ran Out of  Food in the Last Year: Not on file  Transportation Needs:   . Lack of Transportation (Medical): Not on file  . Lack of Transportation (Non-Medical): Not on file  Physical Activity:   . Days of Exercise per Week: Not on file  . Minutes of Exercise per Session: Not on file  Stress:   . Feeling of Stress : Not on file  Social Connections:   . Frequency of Communication with Friends and Family: Not on file  . Frequency of Social Gatherings with Friends and Family: Not on  file  . Attends Religious Services: Not on file  . Active Member of Clubs or Organizations: Not on file  . Attends Archivist Meetings: Not on file  . Marital Status: Not on file  Intimate Partner Violence:   . Fear of Current or Ex-Partner: Not on file  . Emotionally Abused: Not on file  . Physically Abused: Not on file  . Sexually Abused: Not on file    Allergies:  Allergies  Allergen Reactions  . Other     Seasonal Allergies    Medications: Prior to Admission medications   Medication Sig Start Date End Date Taking? Authorizing Provider  clotrimazole-betamethasone (LOTRISONE) cream Apply 1 application topically 2 (two) times daily. 01/10/19  Yes Boscia, Heather E, NP  fexofenadine-pseudoephedrine (ALLEGRA-D 24) 180-240 MG per 24 hr tablet Take 1 tablet by mouth daily.   Yes [provider]  fluticasone (FLONASE) 50 MCG/ACT nasal spray Place 2 sprays into the nose 2 (two) times daily.   Yes [provider]  Multiple Vitamins-Minerals (MULTIVITAMIN WITH MINERALS) tablet Take 1 tablet by mouth daily.   Yes [provider]  NAYZILAM 5 MG/0.1ML SOLN Place 5 mg into the nose as needed. For seizures lasting longer than 5 minutes 09/14/18  Yes Teressa Lower, MD  TROKENDI XR 100 MG CP24 Take 1 capsule every night p.o. 05/06/19  Yes Teressa Lower, MD  norethindrone (MICRONOR) 0.35 MG tablet Take 1 tablet (0.35 mg total) by mouth daily. Take at the same time every day. Do not miss a dose. 07/14/19   Rod Can, CNM    Physical Exam Vitals: Blood pressure 140/82, pulse (!) 102, height 5\' 1"  (1.549 m), weight 200 lb (90.7 kg), last menstrual period 06/23/2019.  General: NAD HEENT: normocephalic, anicteric Thyroid: no enlargement, no palpable nodules Pulmonary: No increased work of breathing, CTAB Cardiovascular: RRR, distal pulses 2+ Breast: Breast symmetrical, no tenderness, no palpable nodules or masses, no skin or nipple retraction present, no  nipple discharge.  No axillary or supraclavicular lymphadenopathy. Abdomen: NABS, soft, non-tender, non-distended.  Umbilicus without lesions.  No hepatomegaly, splenomegaly or masses palpable. No evidence of hernia  Genitourinary:  External: Normal external female genitalia.  Normal urethral meatus, normal Bartholin's and Skene's glands.    Vagina: Normal vaginal mucosa, no evidence of prolapse.    Cervix: Grossly normal in appearance, no bleeding, no CMT  Uterus: deferred for no concerns   Adnexa: deferred for no concerns  Rectal: deferred  Lymphatic: no evidence of inguinal lymphadenopathy Extremities: no edema, erythema, or tenderness Neurologic: Grossly intact Psychiatric: mood appropriate, affect full    Assessment: 23 y.o. G0P0000 routine annual exam  Plan: Problem List Items Addressed This Visit    None    Visit Diagnoses    Well woman exam with routine gynecological exam    -  Primary   Relevant Orders   Cytology - PAP   Flu vaccine need  Relevant Orders   Flu Vaccine QUAD 36+ mos IM (Fluarix, Quad PF) (Completed)   Encounter for initial prescription of contraceptive pills       Relevant Medications   norethindrone (MICRONOR) 0.35 MG tablet   Cervical cancer screening       Relevant Orders   Cytology - PAP   Screen for sexually transmitted diseases       Relevant Orders   Cytology - PAP      1) 4) Gardasil Series discussed and if applicable offered to patient - Patient has previously completed 3 shot series   2) STI screening  was offered and accepted  3)  ASCCP guidelines and rationale discussed.  Patient opts for beginning screening today and every 3 years or sooner as needed screening interval  4) Contraception - the patient is currently using  condoms.  She is interested in changing to pills. Will start with POP for most effectiveness due to Topirimate. We discussed safe sex practices to reduce her furture risk of STI's.    5) Return in about 1 year  (around 07/13/2020) for annual established gyn.   Tresea MallJane Tobin Witucki, CNM [facility] 07/14/2019, 10:24 AM

## 2019-07-18 LAB — CYTOLOGY - PAP
Chlamydia: NEGATIVE
Comment: NEGATIVE
Comment: NEGATIVE
Comment: NORMAL
Neisseria Gonorrhea: NEGATIVE
Trichomonas: NEGATIVE

## 2019-08-18 ENCOUNTER — Ambulatory Visit: Payer: Managed Care, Other (non HMO) | Admitting: Obstetrics and Gynecology

## 2019-08-25 ENCOUNTER — Ambulatory Visit: Payer: Managed Care, Other (non HMO) | Admitting: Obstetrics and Gynecology

## 2019-09-06 ENCOUNTER — Encounter: Payer: Self-pay | Admitting: Obstetrics and Gynecology

## 2019-09-06 ENCOUNTER — Ambulatory Visit (INDEPENDENT_AMBULATORY_CARE_PROVIDER_SITE_OTHER): Payer: Managed Care, Other (non HMO) | Admitting: Obstetrics and Gynecology

## 2019-09-06 ENCOUNTER — Other Ambulatory Visit: Payer: Self-pay

## 2019-09-06 ENCOUNTER — Ambulatory Visit: Payer: Managed Care, Other (non HMO) | Admitting: Obstetrics and Gynecology

## 2019-09-06 VITALS — BP 140/82 | Ht 61.0 in | Wt 204.0 lb

## 2019-09-06 DIAGNOSIS — Z23 Encounter for immunization: Secondary | ICD-10-CM

## 2019-09-06 DIAGNOSIS — R87612 Low grade squamous intraepithelial lesion on cytologic smear of cervix (LGSIL): Secondary | ICD-10-CM

## 2019-09-06 HISTORY — PX: COLPOSCOPY: SHX161

## 2019-09-06 NOTE — Progress Notes (Signed)
Patient ID: Alexis Mills, female   DOB: 1996-04-07, 24 y.o.   MRN: [id number]  Reason for Consult: Colposcopy   Referred by Lavera Guise, MD  Subjective:     HPI:  Alexis Mills is a 24 y.o. female she presents today for colposcopy.  After a LSIL Pap smear.  She reports that this Pap smear was her first Pap smear.  She declined a Pap smear when she was 21.  She is currently taking oral birth control pills.  She is sexually active.  She is a non-smoker.   Past Medical History:  Diagnosis Date  . ADD (attention deficit disorder)   . Seizures ([place])    Family History  Adopted: Yes   Past Surgical History:  Procedure Laterality Date  . COLPOSCOPY  09/06/2019  . NO PAST SURGERIES      Short Social History:  Social History   Tobacco Use  . Smoking status: Never Smoker  . Smokeless tobacco: Never Used  Substance Use Topics  . Alcohol use: No    Allergies  Allergen Reactions  . Other     Seasonal Allergies    Current Outpatient Medications  Medication Sig Dispense Refill  . clotrimazole-betamethasone (LOTRISONE) cream Apply 1 application topically 2 (two) times daily. 45 g 2  . fexofenadine-pseudoephedrine (ALLEGRA-D 24) 180-240 MG per 24 hr tablet Take 1 tablet by mouth daily.    . fluticasone (FLONASE) 50 MCG/ACT nasal spray Place 2 sprays into the nose 2 (two) times daily.    . Multiple Vitamins-Minerals (MULTIVITAMIN WITH MINERALS) tablet Take 1 tablet by mouth daily.    Marland Kitchen NAYZILAM 5 MG/0.1ML SOLN Place 5 mg into the nose as needed. For seizures lasting longer than 5 minutes 2 each 1  . norethindrone (MICRONOR) 0.35 MG tablet Take 1 tablet (0.35 mg total) by mouth daily. Take at the same time every day. Do not miss a dose. 3 Package 4  . TROKENDI XR 100 MG CP24 Take 1 capsule every night p.o. 30 capsule 6   No current facility-administered medications for this visit.    Review of Systems  Constitutional: Negative for chills, fatigue, fever and  unexpected weight change.  HENT: Negative for trouble swallowing.  Eyes: Negative for loss of vision.  Respiratory: Negative for cough, shortness of breath and wheezing.  Cardiovascular: Negative for chest pain, leg swelling, palpitations and syncope.  GI: Negative for abdominal pain, blood in stool, diarrhea, nausea and vomiting.  GU: Negative for difficulty urinating, dysuria, frequency and hematuria.  Musculoskeletal: Negative for back pain, leg pain and joint pain.  Skin: Negative for rash.  Neurological: Negative for dizziness, headaches, light-headedness, numbness and seizures.  Psychiatric: Negative for behavioral problem, confusion, depressed mood and sleep disturbance.        Objective:  Objective   Vitals:   09/06/19 1501  BP: 140/82  Weight: 204 lb (92.5 kg)  Height: 5\' 1"  (1.549 m)   Body mass index is 38.55 kg/m.  Physical Exam Vitals and nursing note reviewed.  Constitutional:      Appearance: She is well-developed.  HENT:     Head: Normocephalic and atraumatic.  Eyes:     Pupils: Pupils are equal, round, and reactive to light.  Cardiovascular:     Rate and Rhythm: Normal rate and regular rhythm.  Pulmonary:     Effort: Pulmonary effort is normal. No respiratory distress.  Skin:    General: Skin is warm and dry.  Neurological:  Mental Status: She is alert and oriented to person, place, and time.  Psychiatric:        Behavior: Behavior normal.        Thought Content: Thought content normal.        Judgment: Judgment normal.       Assessment/Plan:     24 year old with LSIL Pap smear Reviewed recommendations for colposcopy with the patient.  Discussed that given her young age and low incidence of cervical cancer in her age group the recommendation is to repeat the Pap smear in 12 months.  The algorithm from ASCCP which is available copied for your convenience above and can be followed to dictate necessity for colposcopy.  Patient will return to her  PCP office or here for Pap smear in 1 year.  Discussed the Gardasil 9 vaccination with the patient.  It appears that she had 1 dose of this immunization in 2017.  She is interested in continuing her vaccination series and will receive her second dose today and will schedule nurse visit for her third dose in 3 months as per the vaccination catch-up guidelines provided by the CDC.  Discussed maintaining a healthy immune system by getting adequate rest at least 8 hours at night taking a multivitamin, reducing stressors, and not smoking.  More than 10 minutes were spent face to face with the patient in the room with more than 50% of the time spent providing counseling and discussing the plan of management.    Adelene Idler MD [facility] 09/06/2019 3:39 PM

## 2019-12-16 NOTE — Progress Notes (Signed)
Patient: Alexis Mills MRN: [id number] Sex: female DOB: Mar 07, 1996  Provider: Keturah Shavers, MD Location of Care: [facility]  Note type: Routine return visit  Referral Source: Beverely Risen, MD History from: patient and [facility] chart Chief Complaint: Headache  History of Present Illness: Alexis Mills is a 24 y.o. female is here for follow-up management of headache.  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 She was last seen in October 2020 and since then she has not had any frequent headaches and probably just a couple of headaches over the past few months needed OTC medications. She usually sleeps well without any difficulty with no awakening headaches.  She denies having any stress or anxiety issues.  She has no history of fall or head injury recently.  She has been taking her medication which is Trokendi 100 mg daily without any missing doses. Currently she is working and usually she sleeps around 7 to 8 hours every night.  She and her mother do not have any other questions or concerns at this time.  Review of Systems: Review of system as per HPI, otherwise negative.  Past Medical History:  Diagnosis Date  . ADD (attention deficit disorder)   . Seizures (HCC)    Hospitalizations: No., Head Injury: No., Nervous System Infections: No., Immunizations up to date: Yes.     Surgical History Past Surgical History:  Procedure Laterality Date  . COLPOSCOPY  09/06/2019  . NO PAST SURGERIES      Family History family history is not on file. She was adopted.   Social History Social History   Socioeconomic History  . Marital status: Single    Spouse name: Not on  file  . Number of children: Not on file  . Years of education: Not on file  . Highest education level: Not on file  Occupational History  . Occupation: Manufacturing systems engineer    Comment: [employer]  Tobacco Use  . Smoking status: Never Smoker  . Smokeless tobacco: Never Used  Substance and Sexual Activity  . Alcohol use: No  . Drug use: No  . Sexual activity: Yes    Birth control/protection: Pill  Other Topics Concern  . Not on file  Social History Narrative   [name] attends [employer]. She is doing well.   Lives with her adoptive parents. Her adoptive siblings are adult aged and do not live at home.   Patient was adopted at 24 year of age.   Social Determinants of Health   Financial Resource Strain:   . Difficulty of Paying Living Expenses:   Food Insecurity:   . Worried About Programme researcher, broadcasting/film/video in the Last Year:   . Barista in the Last Year:   Transportation Needs:   . Freight forwarder (Medical):   Marland Kitchen Lack of Transportation (Non-Medical):   Physical Activity:   . Days of Exercise per Week:   . Minutes of Exercise per Session:   Stress:   . Feeling of Stress :   Social Connections:   . Frequency of Communication with Friends and Family:   . Frequency of Social Gatherings with Friends and Family:   . Attends Religious Services:   .  Active Member of Clubs or Organizations:   . Attends Archivist Meetings:   Marland Kitchen Marital Status:      Allergies  Allergen Reactions  . Other     Seasonal Allergies    Physical Exam BP 124/76   Pulse 72   Ht 5' 0.95" (1.548 m)   Wt 207 lb 9.6 oz (94.2 kg)   LMP 11/22/2019 (Approximate)   BMI 39.30 kg/m  Gen: Awake, alert, not in distress Skin: No rash, No neurocutaneous stigmata. HEENT: Normocephalic, no dysmorphic features, no conjunctival injection, nares patent, mucous membranes moist, oropharynx clear. Neck: Supple, no meningismus. No focal tenderness. Resp: Clear to  auscultation bilaterally CV: Regular rate, normal S1/S2, no murmurs, no rubs Abd: BS present, abdomen soft, non-tender, non-distended. No hepatosplenomegaly or mass Ext: Warm and well-perfused. No deformities, no muscle wasting, ROM full.  Neurological Examination: MS: Awake, alert, interactive. Normal eye contact, answered the questions appropriately, speech was fluent,  Normal comprehension.  Attention and concentration were normal. Cranial Nerves: Pupils were equal and reactive to light ( 5-16mm);  normal fundoscopic exam with sharp discs, visual field full with confrontation test; EOM normal, no nystagmus; no ptsosis, no double vision, intact facial sensation, face symmetric with full strength of facial muscles, hearing intact to finger rub bilaterally, palate elevation is symmetric, tongue protrusion is symmetric with full movement to both sides.  Sternocleidomastoid and trapezius are with normal strength. Tone-Normal Strength-Normal strength in all muscle groups DTRs-  Biceps Triceps Brachioradialis Patellar Ankle  R 2+ 2+ 2+ 2+ 2+  L 2+ 2+ 2+ 2+ 2+   Plantar responses flexor bilaterally, no clonus noted Sensation: Intact to light touch,  Romberg negative. Coordination: No dysmetria on FTN test. No difficulty with balance. Gait: Normal walk and run. Tandem gait was normal. Was able to perform toe walking and heel walking without difficulty.   Assessment and Plan 1. Migraine variants, not intractable    This is a 24 year old female with remote history of seizure disorder and recent history of migraine and tension type headaches over the past few years for which she has been on moderate dose of Trokendi with no more headaches over the past few months except for a couple of episodes.  She has no focal findings on her neurological examination. I discussed with patient that since she has not had any frequent headaches and has been doing well otherwise, I think we would be able to gradually  taper and discontinue medication and see how she does. I recommend to decrease the dose of Trokendi to 50 mg every night for 1 month and if she is not getting frequent headaches, she may decrease the dose of medication to 50 mg every other night for 1 month and then discontinue medication. If she develops more frequent headaches, she will go back to the previous dose of medication and call my office to send a new prescription. She needs to continue with appropriate hydration and sleep and limited screen time. She needs to start taking dietary supplements such as magnesium and vitamin B2 No follow-up appointment needed at this time but if she develops more frequent headache, she will call my office to schedule a follow-up appointment.  She and her mother understood and agreed with the plan.  Meds ordered this encounter  Medications  . TROKENDI XR 50 MG CP24    Sig: Take 1 capsule every night    Dispense:  30 capsule    Refill:  1

## 2019-12-20 ENCOUNTER — Ambulatory Visit (INDEPENDENT_AMBULATORY_CARE_PROVIDER_SITE_OTHER): Payer: Managed Care, Other (non HMO) | Admitting: Neurology

## 2019-12-20 ENCOUNTER — Encounter (INDEPENDENT_AMBULATORY_CARE_PROVIDER_SITE_OTHER): Payer: Self-pay | Admitting: Neurology

## 2019-12-20 ENCOUNTER — Other Ambulatory Visit: Payer: Self-pay

## 2019-12-20 VITALS — BP 124/76 | HR 72 | Ht 60.95 in | Wt 207.6 lb

## 2019-12-20 DIAGNOSIS — G43809 Other migraine, not intractable, without status migrainosus: Secondary | ICD-10-CM

## 2019-12-20 MED ORDER — TROKENDI XR 50 MG PO CP24: ORAL_CAPSULE | ORAL | 1 refills | Status: DC

## 2019-12-20 NOTE — Patient Instructions (Signed)
Since you have not had any frequent headaches, we may gradually taper and discontinue medication and see how you do Take 50 mg every night for 1 month and if no frequent headaches, start taking 1 capsule every other night for 1 month and then discontinue medication. Continue with more hydration with adequate sleep and limited screen time Start taking dietary supplements If you develop more frequent headaches, call the office and let me know otherwise continue follow-up with your primary care physician.

## 2020-01-04 ENCOUNTER — Ambulatory Visit: Payer: Managed Care, Other (non HMO)

## 2020-01-12 ENCOUNTER — Ambulatory Visit (INDEPENDENT_AMBULATORY_CARE_PROVIDER_SITE_OTHER): Payer: Managed Care, Other (non HMO)

## 2020-01-12 ENCOUNTER — Other Ambulatory Visit: Payer: Self-pay

## 2020-01-12 DIAGNOSIS — Z23 Encounter for immunization: Secondary | ICD-10-CM

## 2020-01-12 NOTE — Patient Instructions (Signed)
Pt here for gardasil #3

## 2020-01-23 ENCOUNTER — Encounter (INDEPENDENT_AMBULATORY_CARE_PROVIDER_SITE_OTHER): Payer: Self-pay

## 2020-05-28 ENCOUNTER — Encounter (INDEPENDENT_AMBULATORY_CARE_PROVIDER_SITE_OTHER): Payer: Self-pay

## 2020-05-28 DIAGNOSIS — G40B09 Juvenile myoclonic epilepsy, not intractable, without status epilepticus: Secondary | ICD-10-CM

## 2020-05-28 NOTE — Telephone Encounter (Signed)
Alexis Mills, Please schedule this patient for a sleep deprived EEG and an appointment on the same day ASAP probably Wednesday or Thursday. You may double book if you need to.  I placed the order for EEG.  Thanks

## 2020-05-28 NOTE — Telephone Encounter (Signed)
After scheduling the appointment and EEG, please call patient herself to let her know with the following phone number: [phone] Thanks

## 2020-06-04 ENCOUNTER — Other Ambulatory Visit: Payer: Self-pay

## 2020-06-04 ENCOUNTER — Encounter (INDEPENDENT_AMBULATORY_CARE_PROVIDER_SITE_OTHER): Payer: Self-pay | Admitting: Neurology

## 2020-06-04 ENCOUNTER — Ambulatory Visit (HOSPITAL_COMMUNITY)
Admission: RE | Admit: 2020-06-04 | Discharge: 2020-06-04 | Disposition: A | Discharge status: Home / Self Care | Payer: Managed Care, Other (non HMO) | Source: Ambulatory Visit | Attending: Physical Medicine and Rehabilitation | Admitting: Physical Medicine and Rehabilitation

## 2020-06-04 ENCOUNTER — Ambulatory Visit (INDEPENDENT_AMBULATORY_CARE_PROVIDER_SITE_OTHER): Payer: Managed Care, Other (non HMO) | Admitting: Neurology

## 2020-06-04 VITALS — BP 112/70 | HR 80 | Ht 61.02 in | Wt 208.8 lb

## 2020-06-04 DIAGNOSIS — G40309 Generalized idiopathic epilepsy and epileptic syndromes, not intractable, without status epilepticus: Secondary | ICD-10-CM

## 2020-06-04 DIAGNOSIS — G43809 Other migraine, not intractable, without status migrainosus: Secondary | ICD-10-CM

## 2020-06-04 DIAGNOSIS — G40B09 Juvenile myoclonic epilepsy, not intractable, without status epilepticus: Secondary | ICD-10-CM

## 2020-06-04 MED ORDER — LEVETIRACETAM ER 750 MG PO TB24: ORAL_TABLET | ORAL | 3 refills | Status: DC

## 2020-06-04 NOTE — Patient Instructions (Addendum)
Your EEG shows occasional generalized discharges We will start Keppra again to prevent from more seizure activity Make a diary of the headaches over the next couple of months Call the office if there are more seizure activity You needs to have adequate sleep and limited screen time  Drink more water and may take occasional Tylenol or ibuprofen for moderate to severe headache Find a new primary care physician and then get a referral to see adult neurology Return in 2 months for follow-up visit

## 2020-06-04 NOTE — Progress Notes (Signed)
Patient: Alexis Mills MRN: [id number] Sex: female DOB: Nov 12, 1995  Provider: Keturah Shavers, MD Location of Care: [facility]  Note type: Routine return visit  Referral Source: Beverely Risen, MD History from: patient and [facility] chart Chief Complaint: EEG @ [facility]  History of Present Illness: Alexis Mills is a 24 y.o. female is here for follow-up visit of a breakthrough seizure activity. 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 She was doing fairly well without having any more seizure activity for a few years and her EEGs were normal so gradually Keppra was tapered and discontinued a couple of years ago and she continued with moderate dose of Trokendi to help with the headaches. On her last visit in June 2021 since she was doing fairly well without having any frequent headaches, she was recommended to taper and discontinue Trokendi as well since she was not having significant symptoms. She discontinue Trokendi about 2 months ago and over the past couple of months she has not been on any medication. She got married recently and she was in honeymoon when she had an episode of seizure activity in bathroom last week that lasted for a couple of minutes with a few minutes of postictal. She has not had any more seizure activity since then but she has been having frequent headaches since that time. She underwent an EEG prior to this visit which was done sleep deprived and showed a few brief clusters of generalized discharges particularly during the high-frequency photic stimulation and toward the end of the recording during drowsiness and sleep state.   Review of Systems: Review of system as per HPI, otherwise negative.  Past Medical  History:  Diagnosis Date  . ADD (attention deficit disorder)   . Seizures (HCC)    Hospitalizations: No., Head Injury: No., Nervous System Infections: No., Immunizations up to date: Yes.     Surgical History Past Surgical History:  Procedure Laterality Date  . COLPOSCOPY  09/06/2019  . NO PAST SURGERIES      Family History family history is not on file. She was adopted.   Social History Social History   Socioeconomic History  . Marital status: Significant Other    Spouse name: Not on file  . Number of children: Not on file  . Years of education: Not on file  . Highest education level: Not on file  Occupational History  . Occupation: Manufacturing systems engineer    Comment: [employer]  Tobacco Use  . Smoking status: Never Smoker  . Smokeless tobacco: Never Used  Vaping Use  . Vaping Use: Never used  Substance and Sexual Activity  . Alcohol use: No  . Drug use: No  . Sexual activity: Yes    Birth control/protection: Pill  Other Topics Concern  . Not on file  Social History Narrative   Alexis Mills attends [employer]. She is doing well.   Patient was adopted at 24 year of age.   Social Determinants of Health   Financial Resource Strain:   . Difficulty of Paying Living Expenses: Not on file  Food Insecurity:   . Worried About Programme researcher, broadcasting/film/video in the Last Year: Not on file  . Ran Out of Food in the Last Year: Not on file  Transportation Needs:   . Freight forwarder (Medical): Not on file  . Lack of Transportation (Non-Medical): Not on file  Physical Activity:   . Days of Exercise per Week: Not on file  . Minutes of Exercise per Session: Not on file  Stress:   . Feeling of Stress : Not on file  Social Connections:   . Frequency of Communication with Friends and Family: Not on file  . Frequency of Social Gatherings with Friends and Family: Not on file  . Attends Religious Services: Not on file  . Active Member of Clubs or  Organizations: Not on file  . Attends Banker Meetings: Not on file  . Marital Status: Not on file     Allergies  Allergen Reactions  . Other     Seasonal Allergies    Physical Exam BP 112/70   Pulse 80   Ht 5' 1.02" (1.55 m)   Wt 208 lb 12.4 oz (94.7 kg)   BMI 39.42 kg/m  Gen: Awake, alert, not in distress, Non-toxic appearance. Skin: No neurocutaneous stigmata, no rash HEENT: Normocephalic, no dysmorphic features, no conjunctival injection, nares patent, mucous membranes moist, oropharynx clear. Neck: Supple, no meningismus, no lymphadenopathy,  Resp: Clear to auscultation bilaterally CV: Regular rate, normal S1/S2, no murmurs, no rubs Abd: Bowel sounds present, abdomen soft, non-tender, non-distended.  No hepatosplenomegaly or mass. Ext: Warm and well-perfused. No deformity, no muscle wasting, ROM full.  Neurological Examination: MS- Awake, alert, interactive Cranial Nerves- Pupils equal, round and reactive to light (5 to 79mm); fix and follows with full and smooth EOM; no nystagmus; no ptosis, funduscopy with normal sharp discs, visual field full by looking at the toys on the side, face symmetric with smile.  Hearing intact to bell bilaterally, palate elevation is symmetric, and tongue protrusion is symmetric. Tone- Normal Strength-Seems to have good strength, symmetrically by observation and passive movement. Reflexes-    Biceps Triceps Brachioradialis Patellar Ankle  R 2+ 2+ 2+ 2+ 2+  L 2+ 2+ 2+ 2+ 2+   Plantar responses flexor bilaterally, no clonus noted Sensation- Withdraw at four limbs to stimuli. Coordination- Reached to the object with no dysmetria Gait: Normal walk without any coordination or balance issues.   Assessment and Plan 1. Juvenile myoclonic epilepsy, not intractable, without status epilepticus (HCC)   2. Migraine variants, not intractable    This is a 24 year old female with history of generalized seizure disorder, juvenile  myoclonic epilepsy since 2014 but with no clinical seizure activity for a few years so Keppra was discontinued and then she was on low to moderate dose Trokendi for headaches which also discontinued a few months ago since she was doing better without having any frequent headaches. She had a breakthrough seizure activity last week and her EEG shows some abnormal discharges particularly with photic stimulation. I discussed with patient that since she had another seizure activity and her EEG is showing some abnormality off of medication, she needs to be on seizure medication probably for long time.  We discussed different options and Trokendi would be a good option to help with seizure and headache but since she is married and there is a chance of pregnancy and also the medication would interact with birth control pills, I do not want to start her on Topamax so I would start her on Keppra which is a fairly safe medication in case of pregnancy although it may not protect her against headaches. I also discussed the other options such as  lamotrigine but she needs to gradually increase the dose of medication and she needs to have blood work frequently.  She would like to start Keppra for now.  She is not planning for pregnancy at this time. I discussed with patient importance of more hydration with adequate sleep and limited screen time to prevent from more seizure activity and more headaches. She will make a headache diary for headache over the next couple of months She will call my office in case of any seizure activity I would like to see her in 2 months for follow-up visit and based on frequency of headache I may start low-dose Topamax or I may start her on another medication such as propranolol or amitriptyline.   Meds ordered this encounter  Medications  . Levetiracetam (KEPPRA XR) 750 MG TB24    Sig: Take 1 tablet every night for 1 week then 2 tablets every night    Dispense:  60 tablet    Refill:  3

## 2020-06-04 NOTE — Progress Notes (Signed)
Sleep deprived EEG completed; results pending. 

## 2020-06-05 NOTE — Procedures (Signed)
Patient:  Alexis Mills   Sex: female  DOB:  [birth year]  Date of study:   06/04/2020               Clinical history: This is a 24 year old female with history of juvenile myoclonic epilepsy for which she was off of medication for a while but with breakthrough seizure activity.  This is a follow-up EEG for evaluation of epileptiform discharges before starting her on medication again.  Medication:    None           Procedure: The tracing was carried out on a 32 channel digital Cadwell recorder reformatted into 16 channel montages with 1 devoted to EKG.  The 10 /20 international system electrode placement was used. Recording was done during awake, drowsiness and sleep states. Recording time 44 minutes.   Description of findings: Background rhythm consists of amplitude of 45 microvolt and frequency of 9-10 hertz posterior dominant rhythm. There was normal anterior posterior gradient noted. Background was well organized, continuous and symmetric with no focal slowing. There was muscle artifact noted. During drowsiness and sleep there was gradual decrease in background frequency noted. During the early stages of sleep there were symmetrical sleep spindles and vertex sharp waves noted.  Hyperventilation resulted in slowing of the background activity. Photic stimulation using stepwise increase in photic frequency resulted in bilateral symmetric driving response. Throughout the recording there were occasional brief clusters of generalized discharges noted, some of them more frontally predominant.  There were also significant hypersynchrony and episodes of generalized discharges noted during photic frequency of 21 Hz which lasted for the entire 10 seconds of stimulation. There were no transient rhythmic activities or electrographic seizures noted. One lead EKG rhythm strip revealed sinus rhythm at a rate of 75 bpm.  Impression: This EEG is abnormal due to occasional brief clusters of generalized  discharges as described with some photoparoxysmal response. The findings are consistent with generalized seizure disorder and possible juvenile myoclonic epilepsy, associated with lower seizure threshold and require careful clinical correlation.    Keturah Shavers, MD

## 2020-08-08 ENCOUNTER — Ambulatory Visit (INDEPENDENT_AMBULATORY_CARE_PROVIDER_SITE_OTHER): Payer: Managed Care, Other (non HMO) | Admitting: Neurology

## 2020-08-08 ENCOUNTER — Other Ambulatory Visit: Payer: Self-pay

## 2020-08-08 ENCOUNTER — Encounter (INDEPENDENT_AMBULATORY_CARE_PROVIDER_SITE_OTHER): Payer: Self-pay | Admitting: Neurology

## 2020-08-08 VITALS — BP 122/72 | HR 82 | Ht 60.63 in | Wt 212.3 lb

## 2020-08-08 DIAGNOSIS — G40B09 Juvenile myoclonic epilepsy, not intractable, without status epilepticus: Secondary | ICD-10-CM

## 2020-08-08 DIAGNOSIS — G43809 Other migraine, not intractable, without status migrainosus: Secondary | ICD-10-CM

## 2020-08-08 MED ORDER — LEVETIRACETAM ER 750 MG PO TB24: ORAL_TABLET | ORAL | 6 refills | Status: DC

## 2020-08-08 NOTE — Patient Instructions (Signed)
Continue the same dose of Keppra at 750 mg daily Continue with occasional use of Tylenol or ibuprofen for moderate to severe headache He needs to have more hydration with limited screen time Continue with adequate sleep Call my office if there are more frequent headaches or any seizure activity Otherwise return in 6 months for follow-up visit

## 2020-08-08 NOTE — Progress Notes (Signed)
Patient: Alexis Mills MRN: [id number] Sex: female DOB: 1996/05/12  Provider: Keturah Shavers, MD Location of Care: [facility] Child Neu[facility]  Note type: Routine return visit  Referral Source: Beverely Risen, MD History from: patient and [facility] chart Chief Complaint: Seizures, Headaches  History of Present Illness: Alexis Mills is a 25 y.o. female is here for follow-up management of headache and seizure disorder.  She has history of seizure disorder and juvenile myoclonic epilepsy since 2014 for which she was initially on Keppra and then switched to Trokendi due to having frequent headaches and then since she was not having seizure for a while, the medication was tapered and she underwent another EEG few months ago which showed some abnormality so she was restarted on AED but she was a started on Keppra again due to having less side effects and in case she would like to get pregnant, this would not have any significant side effects. She was also recommended to drink more water with adequate sleep and limited screen time and see how she does with her headache intensity and frequency and then decide if she needs to be on any preventive medication for headache. Since her last visit in November she has been doing very well without having any more seizure activity except for 1 brief episode of jerking during sleep which her boyfriend noticed. In terms of headache she has not had any frequent headaches except for 2 headaches during the month of December and then last week she got Covid infection with some fever and then over the past couple of days she has had moderate headaches. She usually sleeps well without any difficulty and with no awakening headaches.  She has no vomiting.  She has been taking Keppra regularly without any missing doses and without any side effects.  She has no other complaints or concerns at this time.  Review of Systems: Review of system as per HPI, otherwise  negative.  Past Medical History:  Diagnosis Date  . ADD (attention deficit disorder)   . Seizures (HCC)    Hospitalizations: No., Head Injury: No., Nervous System Infections: No., Immunizations up to date: Yes.    Surgical History Past Surgical History:  Procedure Laterality Date  . COLPOSCOPY  09/06/2019  . NO PAST SURGERIES      Family History family history is not on file. She was adopted.   Social History Social History   Socioeconomic History  . Marital status: Significant Other    Spouse name: Not on file  . Number of children: Not on file  . Years of education: Not on file  . Highest education level: Not on file  Occupational History  . Occupation: Manufacturing systems engineer    Comment: [employer]  Tobacco Use  . Smoking status: Never Smoker  . Smokeless tobacco: Never Used  Vaping Use  . Vaping Use: Never used  Substance and Sexual Activity  . Alcohol use: No  . Drug use: No  . Sexual activity: Yes    Birth control/protection: Pill  Other Topics Concern  . Not on file  Social History Narrative   [name] attends [employer]. She is doing well.   Patient was adopted at 25 year of age.   Social Determinants of Health   Financial Resource Strain: Not on file  Food Insecurity: Not on file  Transportation Needs: Not on file  Physical Activity: Not on file  Stress: Not on file  Social Connections: Not on file  Allergies  Allergen Reactions  . Other     Seasonal Allergies    Physical Exam BP 122/72   Pulse 82   Ht 5' 0.63" (1.54 m)   Wt 212 lb 4.9 oz (96.3 kg)   BMI 40.61 kg/m  Gen: Awake, alert, not in distress Skin: No rash, No neurocutaneous stigmata. HEENT: Normocephalic, no dysmorphic features, no conjunctival injection, nares patent, mucous membranes moist, oropharynx clear. Neck: Supple, no meningismus. No focal tenderness. Resp: Clear to auscultation bilaterally CV: Regular rate, normal S1/S2, no murmurs, no  rubs Abd: BS present, abdomen soft, non-tender, non-distended. No hepatosplenomegaly or mass Ext: Warm and well-perfused. No deformities, no muscle wasting, ROM full.  Neurological Examination: MS: Awake, alert, interactive. Normal eye contact, answered the questions appropriately, speech was fluent,  Normal comprehension.  Attention and concentration were normal. Cranial Nerves: Pupils were equal and reactive to light ( 5-108mm);  normal fundoscopic exam with sharp discs, visual field full with confrontation test; EOM normal, no nystagmus; no ptsosis, no double vision, intact facial sensation, face symmetric with full strength of facial muscles, hearing intact to finger rub bilaterally, palate elevation is symmetric, tongue protrusion is symmetric with full movement to both sides.  Sternocleidomastoid and trapezius are with normal strength. Tone-Normal Strength-Normal strength in all muscle groups DTRs-  Biceps Triceps Brachioradialis Patellar Ankle  R 2+ 2+ 2+ 2+ 2+  L 2+ 2+ 2+ 2+ 2+   Plantar responses flexor bilaterally, no clonus noted Sensation: Intact to light touch,  Romberg negative. Coordination: No dysmetria on FTN test. No difficulty with balance. Gait: Normal walk and run. Tandem gait was normal. Was able to perform toe walking and heel walking without difficulty.   Assessment and Plan 1. Juvenile myoclonic epilepsy, not intractable, without status epilepticus (HCC)   2. Migraine variants, not intractable    This is a 25 year old female with history of juvenile myoclonic epilepsy as well has episodes of headache and migraine, currently on moderate dose of Keppra at 1500 mg daily with good seizure control and she has not had any frequent headaches over the past few months although a few episodes of headache last week are most likely related to Covid. Recommend to continue the same dose of Keppra at 1500 mg daily for now. I do not think she needs to be on any extra medication for  headache since she has not had any frequent headaches for the past few months. She may take occasional Tylenol or ibuprofen for moderate to severe headache She needs to continue with appropriate hydration and sleep and limited screen time If she develops more frequent headaches or any seizure activity she will call my office and let me know I again mentioned that anytime that she would find adult neurology to follow with, there would be no follow-up appointment needed with myself But until then I will recommend to return in 6 months for follow-up visit.  She understood and agreed with the plan.  Meds ordered this encounter  Medications  . Levetiracetam (KEPPRA XR) 750 MG TB24    Sig: Take 2 tablets every night    Dispense:  60 tablet    Refill:  6

## 2020-09-17 ENCOUNTER — Other Ambulatory Visit: Payer: Self-pay

## 2020-09-17 ENCOUNTER — Emergency Department (HOSPITAL_COMMUNITY)
Admission: EM | Admit: 2020-09-17 | Discharge: 2020-09-17 | Disposition: A | Discharge status: Home / Self Care | Payer: Managed Care, Other (non HMO) | Attending: Emergency Medicine | Admitting: Emergency Medicine

## 2020-09-17 ENCOUNTER — Emergency Department (HOSPITAL_COMMUNITY): Payer: Managed Care, Other (non HMO)

## 2020-09-17 DIAGNOSIS — R1010 Upper abdominal pain, unspecified: Secondary | ICD-10-CM

## 2020-09-17 DIAGNOSIS — R110 Nausea: Secondary | ICD-10-CM | POA: Diagnosis not present

## 2020-09-17 LAB — CBC
HCT: 44.1 % (ref 36.0–46.0)
Hemoglobin: 13.9 g/dL (ref 12.0–15.0)
MCH: 27.4 pg (ref 26.0–34.0)
MCHC: 31.5 g/dL (ref 30.0–36.0)
MCV: 86.8 fL (ref 80.0–100.0)
Platelets: 265 10*3/uL (ref 150–400)
RBC: 5.08 MIL/uL (ref 3.87–5.11)
RDW: 12.5 % (ref 11.5–15.5)
WBC: 10.2 10*3/uL (ref 4.0–10.5)
nRBC: 0 % (ref 0.0–0.2)

## 2020-09-17 LAB — URINALYSIS, ROUTINE W REFLEX MICROSCOPIC
Bilirubin Urine: NEGATIVE
Glucose, UA: NEGATIVE mg/dL
Ketones, ur: NEGATIVE mg/dL
Nitrite: NEGATIVE
Protein, ur: NEGATIVE mg/dL
Specific Gravity, Urine: 1.023 (ref 1.005–1.030)
pH: 6 (ref 5.0–8.0)

## 2020-09-17 LAB — COMPREHENSIVE METABOLIC PANEL
ALT: 75 U/L — ABNORMAL HIGH (ref 0–44)
AST: 33 U/L (ref 15–41)
Albumin: 3.9 g/dL (ref 3.5–5.0)
Alkaline Phosphatase: 46 U/L (ref 38–126)
Anion gap: 10 (ref 5–15)
BUN: 9 mg/dL (ref 6–20)
CO2: 25 mmol/L (ref 22–32)
Calcium: 9.1 mg/dL (ref 8.9–10.3)
Chloride: 103 mmol/L (ref 98–111)
Creatinine, Ser: 0.73 mg/dL (ref 0.44–1.00)
GFR, Estimated: >60 mL/min (ref >60)
Glucose, Bld: 77 mg/dL (ref 70–99)
Potassium: 3.7 mmol/L (ref 3.5–5.1)
Sodium: 138 mmol/L (ref 135–145)
Total Bilirubin: 0.6 mg/dL (ref 0.3–1.2)
Total Protein: 7 g/dL (ref 6.5–8.1)

## 2020-09-17 LAB — I-STAT BETA HCG BLOOD, ED (MC, WL, AP ONLY): I-stat hCG, quantitative: <5 m[IU]/mL (ref <5)

## 2020-09-17 LAB — LIPASE, BLOOD: Lipase: 29 U/L (ref 11–51)

## 2020-09-17 MED ORDER — MORPHINE SULFATE (PF) 4 MG/ML IV SOLN
4.0000 mg | Freq: Once | INTRAVENOUS | Status: AC
  Administered 2020-09-17: 4 mg via INTRAVENOUS
  Filled 2020-09-17: qty 1

## 2020-09-17 MED ORDER — ONDANSETRON 4 MG PO TBDP: 4.0000 mg | ORAL_TABLET | Freq: Three times a day (TID) | ORAL | 0 refills | Status: DC | PRN

## 2020-09-17 MED ORDER — IOHEXOL 300 MG/ML  SOLN
76.0000 mL | Freq: Once | INTRAMUSCULAR | Status: AC | PRN
  Administered 2020-09-17: 76 mL via INTRAVENOUS

## 2020-09-17 MED ORDER — ONDANSETRON HCL 4 MG/2ML IJ SOLN
4.0000 mg | Freq: Once | INTRAMUSCULAR | Status: AC
  Administered 2020-09-17: 4 mg via INTRAVENOUS
  Filled 2020-09-17: qty 2

## 2020-09-17 NOTE — ED Triage Notes (Signed)
Pt reports bilateral flank pain, now radiating around to her abdomen x 1 week. Endorses nausea x 2 weeks. LMP sometime in January but reports negative pregnancy test today.

## 2020-09-17 NOTE — Discharge Instructions (Addendum)
Use Zofran as needed for nausea or vomiting. Use Tylenol or ibuprofen as needed for pain. If you do not have a primary care doctor, you may call the number on the back of your insurance.  She is primarily within the network.  You may also call the number clinic below to see if they are in network and if they have appointments available. Return to the emergency room if you develop high fevers, persistent vomiting despite medication, severe worsening pain, or any new, worsening or concerning symptoms

## 2020-09-17 NOTE — ED Provider Notes (Signed)
[facility] EMERGENCY DEPARTMENT Provider Note   CSN: [id number] Arrival date & time: 09/17/20  1218     History Chief Complaint  Patient presents with  . Abdominal Pain  . Flank Pain    Alexis Mills is a 25 y.o. female presenting for evaluation of abdominal pain.  Patient states for the past week she has been having persistent upper abdominal pain with radiation to both flanks.  She has associated nausea, no vomiting.  She is taking ibuprofen with mild, short-lived relief.  She denies fevers, chills, chest pain, cough, shortness of breath, urinary symptoms, abnormal bowel movements.  Her last period was in January, she is normally regular but has not had a period this month. She went to UC today, had neg preg and ?cysitis. However pt states her sxs are different than last UTI. She denies sick contacts.  No previous problems with her abd or previous abd surgeries. She has a h/o sz for which she takes keppra, no other medical problems. Nothing makes her sxs worse including PO intake, mvt, or BMs.   HPI     Past Medical History:  Diagnosis Date  . ADD (attention deficit disorder)   . Seizures [facility])     Patient Active Problem List   Diagnosis Date Noted  . Atopic dermatitis 01/10/2019  . Migraine variants, not intractable 09/14/2018  . Juvenile myoclonic epilepsy, not intractable, without status epilepticus (HCC) 07/19/2014  . Juvenile myoclonic epilepsy (HCC) 10/20/2012  . 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 COLPOSCOPY  09/06/2019  . NO PAST SURGERIES       OB History    Gravida  0   Para  0   Term  0   Preterm  0   AB  0   Living  0     SAB  0   IAB  0   Ectopic  0   Multiple  0   Live Births  0           Family History  Adopted: Yes    Social History   Tobacco Use  . Smoking status: Never Smoker  . Smokeless tobacco: Never Used  Vaping Use   . Vaping Use: Never used  Substance Use Topics  . Alcohol use: No  . Drug use: No    Home Medications Prior to Admission medications   Medication Sig Start Date End Date Taking? Authorizing Provider  ondansetron (ZOFRAN ODT) 4 MG disintegrating tablet Take 1 tablet (4 mg total) by mouth every 8 (eight) hours as needed for nausea or vomiting. 09/17/20  Yes Brando Taves, PA-C  clotrimazole-betamethasone (LOTRISONE) cream Apply 1 application topically 2 (two) times daily. Patient not taking: Reported on 08/08/2020 01/10/19   Carlean Jews, NP  fexofenadine-pseudoephedrine (ALLEGRA-D 24) 180-240 MG per 24 hr tablet Take 1 tablet by mouth daily.    [provider]  fluticasone (FLONASE) 50 MCG/ACT nasal spray Place 2 sprays into the nose 2 (two) times daily.    [provider]  Levetiracetam (KEPPRA XR) 750 MG TB24 Take 2 tablets every night 08/08/20   Keturah Shavers, MD  Multiple Vitamins-Minerals (MULTIVITAMIN WITH MINERALS) tablet Take 1 tablet by mouth daily.    [provider]  NAYZILAM 5 MG/0.1ML SOLN Place 5 mg into the nose as needed. For seizures lasting longer than 5 minutes 09/14/18   Keturah Shavers, MD  norethindrone (MICRONOR) 0.35 MG tablet Take  1 tablet (0.35 mg total) by mouth daily. Take at the same time every day. Do not miss a dose. Patient not taking: No sig reported 07/14/19   Tresea Mall, CNM    Allergies    Other  Review of Systems   Review of Systems  Gastrointestinal: Positive for abdominal pain.  Genitourinary: Positive for flank pain and frequency.  All other systems reviewed and are negative.   Physical Exam Updated Vital Signs BP 119/74 (BP Location: Left Arm)   Pulse 85   Temp 98 F (36.7 C) (Oral)   Resp 16   LMP 07/28/2020 (Approximate)   SpO2 97%   Physical Exam Vitals and nursing note reviewed.  Constitutional:      General: She is not in acute distress.    Appearance: She is well-developed and  well-nourished.     Comments: Sitting in the bed in NAD  HENT:     Head: Normocephalic and atraumatic.  Eyes:     Extraocular Movements: EOM normal.     Conjunctiva/sclera: Conjunctivae normal.     Pupils: Pupils are equal, round, and reactive to light.  Cardiovascular:     Rate and Rhythm: Normal rate and regular rhythm.     Pulses: Normal pulses and intact distal pulses.  Pulmonary:     Effort: Pulmonary effort is normal. No respiratory distress.     Breath sounds: Normal breath sounds. No wheezing.  Abdominal:     General: There is no distension.     Palpations: Abdomen is soft. There is no mass.     Tenderness: There is abdominal tenderness. There is no guarding or rebound.     Comments: ttp of upper abd bilaterally. No CVA tenderness. No rigidity, guarding, distention. Negative rebound.   Musculoskeletal:        General: Tenderness present. Normal range of motion.     Cervical back: Normal range of motion and neck supple.     Comments: ttp of low back bilaterally.   Skin:    General: Skin is warm and dry.     Capillary Refill: Capillary refill takes less than 2 seconds.  Neurological:     Mental Status: She is alert and oriented to person, place, and time.  Psychiatric:        Mood and Affect: Mood and affect normal.     ED Results / Procedures / Treatments   Labs (all labs ordered are listed, but only abnormal results are displayed) Labs Reviewed  COMPREHENSIVE METABOLIC PANEL - Abnormal; Notable for the following components:      Result Value   ALT 75 (*)    All other components within normal limits  URINALYSIS, ROUTINE W REFLEX MICROSCOPIC - Abnormal; Notable for the following components:   APPearance HAZY (*)    Hgb urine dipstick SMALL (*)    Leukocytes,Ua TRACE (*)    Bacteria, UA RARE (*)    All other components within normal limits  LIPASE, BLOOD  CBC  I-STAT BETA HCG BLOOD, ED ([facility], [facility], [facility] ONLY)    EKG None  Radiology CT ABDOMEN PELVIS W  CONTRAST  Result Date: 09/17/2020 CLINICAL DATA:  Nausea and vomiting. EXAM: CT ABDOMEN AND PELVIS WITH CONTRAST TECHNIQUE: Multidetector CT imaging of the abdomen and pelvis was performed using the standard protocol following bolus administration of intravenous contrast. CONTRAST:  33mL OMNIPAQUE IOHEXOL 300 MG/ML  SOLN COMPARISON:  06/15/2012 FINDINGS: Lower chest: The lung bases are clear. The heart size is normal. Hepatobiliary: There is decreased  hepatic attenuation suggestive of hepatic steatosis. Normal gallbladder.There is no biliary ductal dilation. Pancreas: Normal contours without ductal dilatation. No peripancreatic fluid collection. Spleen: Unremarkable. Adrenals/Urinary Tract: --Adrenal glands: Unremarkable. --Right kidney/ureter: No hydronephrosis or radiopaque kidney stones. --Left kidney/ureter: No hydronephrosis or radiopaque kidney stones. --Urinary bladder: Unremarkable. Stomach/Bowel: --Stomach/Duodenum: No hiatal hernia or other gastric abnormality. Normal duodenal course and caliber. --Small bowel: Unremarkable. --Colon: Unremarkable. --Appendix: Normal. Vascular/Lymphatic: Normal course and caliber of the major abdominal vessels. --No retroperitoneal lymphadenopathy. --No mesenteric lymphadenopathy. --No pelvic or inguinal lymphadenopathy. Reproductive: Unremarkable Other: No ascites or free air. The abdominal wall is normal. Musculoskeletal. No acute displaced fractures. IMPRESSION: 1. No acute abdominopelvic abnormality. 2. Hepatic steatosis. Electronically Signed   By: Katherine Mantle M.D.   On: 09/17/2020 18:17    Procedures Procedures   Medications Ordered in ED Medications  ondansetron (ZOFRAN) injection 4 mg (4 mg Intravenous Given 09/17/20 1730)  morphine 4 MG/ML injection 4 mg (4 mg Intravenous Given 09/17/20 1740)  iohexol (OMNIPAQUE) 300 MG/ML solution 76 mL (76 mLs Intravenous Contrast Given 09/17/20 1807)    ED Course  I have reviewed the triage vital signs and the  nursing notes.  Pertinent labs & imaging results that were available during my care of the patient were reviewed by me and considered in my medical decision making (see chart for details).    MDM Rules/Calculators/A&P                          Patient presenting for evaluation of abdominal pain and nausea.  On exam, patient appears nontoxic.  She does have tenderness palpation of bilateral upper quadrants.  No CVA tenderness on exam, however he does have palpation bilateral low back.  Consider ascending UTI.  Consider kidney stone, but with significant bilateral.  Consider gallbladder etiology.  Consider viral GI illness.  Labs obtained from triage interpreted by me, overall reassuring.  No leukocytosis.  Electrolytes stable.  Offered symptomatic treatment and close follow-up, however patient exteremly concerned about her sxs. As she has had sxs for a week, will order CT for further eval.   CT negative for acute findings.  Discussed with patient.  Discussed symptomatic management.  She is feeling better after medications given in the ED.  At this time, patient appears safe for discharge.  Return precautions given.  Patient states she understands and agrees to plan.  Final Clinical Impression(s) / ED Diagnoses Final diagnoses:  Upper abdominal pain  Nausea    Rx / DC Orders ED Discharge Orders         Ordered    ondansetron (ZOFRAN ODT) 4 MG disintegrating tablet  Every 8 hours PRN        09/17/20 1845           Alveria Apley, PA-C 09/17/20 1906    Mancel Bale, MD 09/17/20 2211

## 2020-09-26 ENCOUNTER — Encounter (INDEPENDENT_AMBULATORY_CARE_PROVIDER_SITE_OTHER): Payer: Self-pay

## 2020-11-15 ENCOUNTER — Ambulatory Visit: Payer: Managed Care, Other (non HMO) | Admitting: Nurse Practitioner

## 2020-12-24 ENCOUNTER — Ambulatory Visit: Payer: Managed Care, Other (non HMO) | Admitting: Family Medicine

## 2020-12-24 DIAGNOSIS — Z0289 Encounter for other administrative examinations: Secondary | ICD-10-CM

## 2020-12-26 ENCOUNTER — Other Ambulatory Visit (INDEPENDENT_AMBULATORY_CARE_PROVIDER_SITE_OTHER): Payer: Self-pay | Admitting: Neurology

## 2020-12-27 ENCOUNTER — Encounter (INDEPENDENT_AMBULATORY_CARE_PROVIDER_SITE_OTHER): Payer: Self-pay

## 2020-12-29 ENCOUNTER — Encounter (INDEPENDENT_AMBULATORY_CARE_PROVIDER_SITE_OTHER): Payer: Self-pay

## 2021-01-18 ENCOUNTER — Encounter (INDEPENDENT_AMBULATORY_CARE_PROVIDER_SITE_OTHER): Payer: Self-pay

## 2021-02-05 ENCOUNTER — Ambulatory Visit (INDEPENDENT_AMBULATORY_CARE_PROVIDER_SITE_OTHER): Payer: Managed Care, Other (non HMO) | Admitting: Neurology

## 2021-03-06 ENCOUNTER — Other Ambulatory Visit: Payer: Self-pay

## 2021-03-06 ENCOUNTER — Ambulatory Visit (INDEPENDENT_AMBULATORY_CARE_PROVIDER_SITE_OTHER): Payer: Managed Care, Other (non HMO) | Admitting: Neurology

## 2021-03-06 ENCOUNTER — Encounter (INDEPENDENT_AMBULATORY_CARE_PROVIDER_SITE_OTHER): Payer: Self-pay | Admitting: Neurology

## 2021-03-06 VITALS — BP 127/74 | HR 88 | Ht 61.54 in | Wt 210.1 lb

## 2021-03-06 DIAGNOSIS — G40B09 Juvenile myoclonic epilepsy, not intractable, without status epilepticus: Secondary | ICD-10-CM

## 2021-03-06 DIAGNOSIS — G43809 Other migraine, not intractable, without status migrainosus: Secondary | ICD-10-CM | POA: Diagnosis not present

## 2021-03-06 MED ORDER — LEVETIRACETAM ER 750 MG PO TB24: ORAL_TABLET | ORAL | 2 refills | Status: DC

## 2021-03-06 MED ORDER — TOPIRAMATE 50 MG PO TABS: ORAL_TABLET | ORAL | 5 refills | Status: DC

## 2021-03-06 NOTE — Progress Notes (Signed)
Patient: Alexis Mills MRN: [id number] Sex: female DOB: [birth year]  Provider: Keturah Shavers, MD Location of Care: [facility] Child Neurology  Note type: Routine return visit  Referral Source: PCP History from: patient and [facility] chart Chief Complaint: Frequent headache, seizure disorder  History of Present Illness: Alexis Mills is a 25 y.o. female is here for follow-up visit of seizure disorder and also having more frequent headaches over the past couple of months. Patient has history of juvenile myoclonic epilepsy since 2014 as well as having episodes of migraine and tension type headaches and she has been on Keppra for a long time and previously she was on Trokendi as a preventive medication for headache as well. Last year she was tried off of Keppra but she had another seizure and her EEG was abnormal so she was restarted on medication. Around the same time since she was doing better in terms of headache, Trokendi was discontinued and she has not been on any preventive medication for headache and she was doing okay until about couple months ago when she started having more frequent headache and almost daily headaches for which she may need to use OTC medications a few times a week although most of the time is not working with the headaches. The headaches are with moderate intensity that may last all day long and some of them might be accompanied by sensitivity to light and nausea but usually she does not have any vomiting. She usually sleeps well without any difficulty and with no awakening headaches.  She does not have any other triggers except for anxiety on her job that she thinks may cause her having more headaches.  Review of Systems: Review of system as per HPI, otherwise negative.  Past Medical History:  Diagnosis Date   ADD (attention deficit disorder)    Seizures (HCC)    Hospitalizations: No., Head Injury: No., Nervous System Infections: No., Immunizations up to  date: Yes.     Surgical History Past Surgical History:  Procedure Laterality Date   COLPOSCOPY  09/06/2019   NO PAST SURGERIES      Family History family history is not on file. She was adopted.   Social History Social History   Socioeconomic History   Marital status: Married    Spouse name: Not on file   Number of children: Not on file   Years of education: Not on file   Highest education level: Not on file  Occupational History   Occupation: Preschool teacher    Comment: [employer]  Tobacco Use   Smoking status: Never   Smokeless tobacco: Never  Vaping Use   Vaping Use: Never used  Substance and Sexual Activity   Alcohol use: No   Drug use: No   Sexual activity: Yes    Birth control/protection: Pill  Other Topics Concern   Not on file  Social History Narrative   Ivery attends [employer]. She is doing well.   Patient was adopted at 25 year of age.   Social Determinants of Health   Financial Resource Strain: Not on file  Food Insecurity: Not on file  Transportation Needs: Not on file  Physical Activity: Not on file  Stress: Not on file  Social Connections: Not on file     Allergies  Allergen Reactions   Other     Seasonal Allergies    Physical Exam BP 127/74   Pulse 88   Ht 5' 1.54" (1.563 m)   Wt 210 lb  1.6 oz (95.3 kg)   BMI 39.01 kg/m  Gen: Awake, alert, not in distress Skin: No rash, No neurocutaneous stigmata. HEENT: Normocephalic, no dysmorphic features, no conjunctival injection, nares patent, mucous membranes moist, oropharynx clear. Neck: Supple, no meningismus. No focal tenderness. Resp: Clear to auscultation bilaterally CV: Regular rate, normal S1/S2, no murmurs, no rubs Abd: BS present, abdomen soft, non-tender, non-distended. No hepatosplenomegaly or mass Ext: Warm and well-perfused. No deformities, no muscle wasting, ROM full.  Neurological Examination: MS: Awake, alert, interactive. Normal eye  contact, answered the questions appropriately, speech was fluent,  Normal comprehension.  Attention and concentration were normal. Cranial Nerves: Pupils were equal and reactive to light ( 5-44mm);  normal fundoscopic exam with sharp discs, visual field full with confrontation test; EOM normal, no nystagmus; no ptsosis, no double vision, intact facial sensation, face symmetric with full strength of facial muscles, hearing intact to finger rub bilaterally, palate elevation is symmetric, tongue protrusion is symmetric with full movement to both sides.  Sternocleidomastoid and trapezius are with normal strength. Tone-Normal Strength-Normal strength in all muscle groups DTRs-  Biceps Triceps Brachioradialis Patellar Ankle  R 2+ 2+ 2+ 2+ 2+  L 2+ 2+ 2+ 2+ 2+   Plantar responses flexor bilaterally, no clonus noted Sensation: Intact to light touch,  Romberg negative. Coordination: No dysmetria on FTN test. No difficulty with balance. Gait: Normal walk and run. Tandem gait was normal. Was able to perform toe walking and heel walking without difficulty.   Assessment and Plan 1. Migraine variants, not intractable   2. Juvenile myoclonic epilepsy, not intractable, without status epilepticus (HCC)    This is a 25 year old female with diagnosis of juvenile myoclonic epilepsy since 2014 and episodes of migraine and tension type headaches with recent flaring up with more frequent headaches not responding to OTC medications.  She has no focal findings on her neurological examination with no evidence of intracranial pathology. Recommend to continue the same dose of Keppra at 1500 mg nightly Since she is having frequent headaches, I would start her again on Topamax at 50 mg every night and then 50 mg twice daily and see how she does. I discussed with patient that this medication may interfere with birth control pills and may cause pregnancy and also discussed that in case of pregnancy she needs to stop Topamax  immediately. She needs to have more hydration with adequate sleep and limiting screen time to prevent from more headaches She may take occasional Tylenol or ibuprofen for moderate to severe headache If she continues with more frequent headache or any seizure activity, she will call my office and let me know I would like to see her again in 5 months for follow-up visit or if she would be able to get an appointment with adult neurology then she does not need follow-up visit with neurology.  She understood and agreed with the plan.   Meds ordered this encounter  Medications   DISCONTD: topiramate (TOPAMAX) 50 MG tablet    Sig: 50 mg nightly for 1 week then 50 mg twice daily    Dispense:  60 tablet    Refill:  5   Levetiracetam 750 MG TB24    Sig: TAKE 2 TABLETS BY MOUTH AT  NIGHT    Dispense:  180 tablet    Refill:  2    Requesting 1 year supply   topiramate (TOPAMAX) 50 MG tablet    Sig: 50 mg nightly for 1 week then 50 mg twice daily  Dispense:  60 tablet    Refill:  5

## 2021-03-06 NOTE — Patient Instructions (Addendum)
Continue the same dose of Keppra at 1500 mg every night We will start Topamax 50 mg every night for 1 week then 50 mg twice daily Continue with more hydration, adequate sleep and limiting screen time May take occasional Tylenol or ibuprofen for moderate to severe headache Topamax may interfere with birth control pills and increase the chance of pregnancy In case of pregnancy, you have to stop Topamax immediately. Return in 5 months for follow-up visit or follow-up with adult neurology

## 2021-07-23 ENCOUNTER — Encounter (INDEPENDENT_AMBULATORY_CARE_PROVIDER_SITE_OTHER): Payer: Self-pay | Admitting: Neurology

## 2021-07-24 ENCOUNTER — Other Ambulatory Visit (INDEPENDENT_AMBULATORY_CARE_PROVIDER_SITE_OTHER): Payer: Self-pay | Admitting: Neurology

## 2021-07-24 ENCOUNTER — Encounter (INDEPENDENT_AMBULATORY_CARE_PROVIDER_SITE_OTHER): Payer: Self-pay | Admitting: Neurology

## 2021-07-24 MED ORDER — TOPIRAMATE 50 MG PO TABS: ORAL_TABLET | ORAL | 5 refills | Status: DC

## 2021-07-24 MED ORDER — LEVETIRACETAM ER 750 MG PO TB24: ORAL_TABLET | ORAL | 2 refills | Status: DC

## 2021-07-26 MED ORDER — LEVETIRACETAM ER 750 MG PO TB24: ORAL_TABLET | ORAL | 2 refills | Status: DC

## 2021-07-26 MED ORDER — TOPIRAMATE 50 MG PO TABS: ORAL_TABLET | ORAL | 5 refills | Status: DC

## 2021-07-26 NOTE — Addendum Note (Signed)
Addended by: Vallery Sa on: 07/26/2021 04:57 PM   Modules accepted: Orders

## 2021-08-06 ENCOUNTER — Ambulatory Visit (INDEPENDENT_AMBULATORY_CARE_PROVIDER_SITE_OTHER): Payer: Managed Care, Other (non HMO) | Admitting: Neurology

## 2021-08-14 ENCOUNTER — Other Ambulatory Visit: Payer: Self-pay

## 2021-08-14 ENCOUNTER — Encounter (INDEPENDENT_AMBULATORY_CARE_PROVIDER_SITE_OTHER): Payer: Self-pay | Admitting: Neurology

## 2021-08-14 ENCOUNTER — Ambulatory Visit (INDEPENDENT_AMBULATORY_CARE_PROVIDER_SITE_OTHER): Payer: PRIVATE HEALTH INSURANCE | Admitting: Neurology

## 2021-08-14 VITALS — BP 120/76 | Wt 208.3 lb

## 2021-08-14 DIAGNOSIS — G40B09 Juvenile myoclonic epilepsy, not intractable, without status epilepticus: Secondary | ICD-10-CM | POA: Diagnosis not present

## 2021-08-14 DIAGNOSIS — G43809 Other migraine, not intractable, without status migrainosus: Secondary | ICD-10-CM

## 2021-08-14 DIAGNOSIS — R519 Headache, unspecified: Secondary | ICD-10-CM

## 2021-08-14 MED ORDER — TOPIRAMATE 50 MG PO TABS: ORAL_TABLET | ORAL | 7 refills | Status: DC

## 2021-08-14 MED ORDER — LEVETIRACETAM ER 750 MG PO TB24: ORAL_TABLET | ORAL | 2 refills | Status: DC

## 2021-08-14 NOTE — Progress Notes (Signed)
Patient: Alexis Mills MRN: [id number] Sex: female DOB: November 06, 1995  Provider: Keturah Shavers, MD Location of Care: [facility]  Note type: Routine return visit  Referral Source: PCP History from: patient and [facility] chart Chief Complaint: Follow Up, headaches, seizures  History of Present Illness: Alexis Mills is a 26 y.o. female is here for follow-up management of seizure disorder and headache. She has a diagnosis of juvenile myoclonic epilepsy since 2014 as well as episodes of migraine and tension type headaches. She was tried off of Keppra once in 2021 and she had a breakthrough seizure so she was restarted back on medication. She was last seen in August and since she was doing okay with no seizure activity, she was recommended to continue the same dose of Keppra at 1500 mg every night. Since she was having more frequent headaches, she was started on Topamax with the current dose of 50 mg twice daily with significant improvement of the headaches and as per patient over the past couple of months she has not had any major headache and did not need to take OTC medications. She usually sleeps well without any difficulty and with no awakening headaches.  She has no behavioral or mood issues.  Currently she is not taking any birth control pills and she does not have any plan for pregnancy. At this time she does not have any primary care physician and we discussed that as soon as she gets 1, she needs to get a referral to see adult neurology.   Review of Systems: Review of system as per HPI, otherwise negative.  Past Medical History:  Diagnosis Date   ADD (attention deficit disorder)    Seizures (HCC)    Hospitalizations: No., Head Injury: No., Nervous System Infections: No., Immunizations up to date: Yes.      Surgical History Past Surgical History:  Procedure Laterality Date   COLPOSCOPY  09/06/2019   NO PAST SURGERIES      Family History family  history is not on file. She was adopted.  Social History Social History   Socioeconomic History   Marital status: Married    Spouse name: Not on file   Number of children: Not on file   Years of education: Not on file   Highest education level: Not on file  Occupational History   Occupation: Preschool teacher    Comment: [employer]  Tobacco Use   Smoking status: Never    Passive exposure: Never   Smokeless tobacco: Never  Vaping Use   Vaping Use: Never used  Substance and Sexual Activity   Alcohol use: No   Drug use: No   Sexual activity: Yes    Birth control/protection: Pill  Other Topics Concern   Not on file  Social History Narrative   Chiara attends [employer]. She is doing well.   Patient was adopted at 26 year of age.   Social Determinants of Health   Financial Resource Strain: Not on file  Food Insecurity: Not on file  Transportation Needs: Not on file  Physical Activity: Not on file  Stress: Not on file  Social Connections: Not on file     Allergies  Allergen Reactions   Other     Seasonal Allergies    Physical Exam BP 120/76    Wt 208 lb 5.4 oz (94.5 kg)    BMI 38.68 kg/m  Gen: Awake, alert, not in distress Skin: No rash, No neurocutaneous stigmata. HEENT: Normocephalic, no dysmorphic  features, no conjunctival injection, nares patent, mucous membranes moist, oropharynx clear. Neck: Supple, no meningismus. No focal tenderness. Resp: Clear to auscultation bilaterally CV: Regular rate, normal S1/S2, no murmurs, no rubs Abd: BS present, abdomen soft, non-tender, non-distended. No hepatosplenomegaly or mass Ext: Warm and well-perfused. No deformities, no muscle wasting, ROM full.  Neurological Examination: MS: Awake, alert, interactive. Normal eye contact, answered the questions appropriately, speech was fluent,  Normal comprehension.  Attention and concentration were normal. Cranial Nerves: Pupils were equal and  reactive to light ( 5-60mm);  normal fundoscopic exam with sharp discs, visual field full with confrontation test; EOM normal, no nystagmus; no ptsosis, no double vision, intact facial sensation, face symmetric with full strength of facial muscles, hearing intact to finger rub bilaterally, palate elevation is symmetric, tongue protrusion is symmetric with full movement to both sides.  Sternocleidomastoid and trapezius are with normal strength. Tone-Normal Strength-Normal strength in all muscle groups DTRs-  Biceps Triceps Brachioradialis Patellar Ankle  R 2+ 2+ 2+ 2+ 2+  L 2+ 2+ 2+ 2+ 2+   Plantar responses flexor bilaterally, no clonus noted Sensation: Intact to light touch, temperature, vibration, Romberg negative. Coordination: No dysmetria on FTN test. No difficulty with balance. Gait: Normal walk and run. Tandem gait was normal. Was able to perform toe walking and heel walking without difficulty.   Assessment and Plan 1. Migraine variants, not intractable   2. Juvenile myoclonic epilepsy, not intractable, without status epilepticus (HCC)   3. Moderate headache   This is an 46 and half-year-old female with diagnosis of juvenile myoclonic epilepsy since 2014 as well as intermittent episodes of migraine and tension type headaches, currently on moderate dose of Keppra and Topamax with good seizure control and headache control without any side effects.  She has no focal findings on her neurological examination. Recommend to continue the same dose of Keppra at 1500 mg every night Since she is not having any headaches, I will decrease the dose of Topamax to just 50 mg every night She will continue with appropriate hydration and sleep and limiting screen time If she develops more frequent headaches, she will call my office to go back up on Topamax I discussed with patient that if she gets pregnant, she needs to stop Topamax immediately I also discussed with patient that she needs to get a  referral to see adult neurologist soon as possible If she could not do so then I would like to schedule for sleep deprived EEG at the same time with the next visit and she will return in 7 months for follow-up visit.  She understood and agreed with the plan.   Meds ordered this encounter  Medications   topiramate (TOPAMAX) 50 MG tablet    Sig: 50 mg nightly    Dispense:  30 tablet    Refill:  7   Levetiracetam 750 MG TB24    Sig: TAKE 2 TABLETS BY MOUTH AT  NIGHT    Dispense:  180 tablet    Refill:  2   Orders Placed This Encounter  Procedures   Child sleep deprived EEG    Standing Status:   Future    Standing Expiration Date:   08/14/2022    Scheduling Instructions:     To be done at the same time the next appointment in 7 months    Order Specific Question:   Where should this test be performed?    Answer:   [facility]

## 2021-08-14 NOTE — Patient Instructions (Addendum)
Continue same dose of Keppra at 1500 every night Decrease the dose of Topamax to 50 mg every night If you get pregnant, immediately discontinue Topamax Continue with adequate sleep and limited screen time and more hydration If the headaches are getting more frequent, call my office to increase the dose of Topamax May try to get a referral to see adult neurology over the next few months Otherwise we will schedule for EEG at the same time the next appointment  return in 7 months for follow-up visit

## 2021-08-22 ENCOUNTER — Encounter (INDEPENDENT_AMBULATORY_CARE_PROVIDER_SITE_OTHER): Payer: Self-pay | Admitting: Neurology

## 2021-09-13 ENCOUNTER — Other Ambulatory Visit: Payer: Self-pay

## 2021-09-13 ENCOUNTER — Ambulatory Visit: Payer: No Typology Code available for payment source | Admitting: Family

## 2021-09-13 ENCOUNTER — Encounter: Payer: Self-pay | Admitting: Family

## 2021-09-13 VITALS — BP 117/84 | HR 101 | Temp 98.4°F | Ht 61.0 in | Wt 207.6 lb

## 2021-09-13 DIAGNOSIS — Z23 Encounter for immunization: Secondary | ICD-10-CM

## 2021-09-13 DIAGNOSIS — G40309 Generalized idiopathic epilepsy and epileptic syndromes, not intractable, without status epilepticus: Secondary | ICD-10-CM | POA: Diagnosis not present

## 2021-09-13 NOTE — Progress Notes (Signed)
? ?New Patient Office Visit ? ?Subjective:  ?Patient ID: Alexis Mills, female    DOB: 1995/09/09  Age: 26 y.o. MRN: [id number] ? ?CC:  ?Chief Complaint  ?Patient presents with  ? Establish Care  ? Seizures  ?  Referral to neurology.  ? ?HPI ?Alexis Mills presents for establishing care and to discuss one problem. ?Seizure Disorder: Episodes first started at age 68 . Onset of symptoms was sudden, not related to any specific activity. She has had multiple episodes. She has had completely unresponsive during worst of episode. Abnormal movements tonic-clonic movements present. Episode duration is a few minutes. Seizures appear to be precipitated by emotional stress. Patient does not have a history of head trauma.  She does have a family history of seizure disorder. Treatment thus far has been: levetiracetam (Keppra), with good results.  ? ?Past Medical History:  ?Diagnosis Date  ? ADD (attention deficit disorder)   ? Seizures (HCC)   ? ? ?Past Surgical History:  ?Procedure Laterality Date  ? COLPOSCOPY  09/06/2019  ? NO PAST SURGERIES    ? ? ?Family History  ?Adopted: Yes  ? ? ?Social History  ? ?Socioeconomic History  ? Marital status: Married  ?  Spouse name: Not on file  ? Number of children: Not on file  ? Years of education: Not on file  ? Highest education level: Not on file  ?Occupational History  ? Occupation: Manufacturing systems engineer  ?  Comment: [employer]  ?Tobacco Use  ? Smoking status: Never  ?  Passive exposure: Never  ? Smokeless tobacco: Never  ?Vaping Use  ? Vaping Use: Never used  ?Substance and Sexual Activity  ? Alcohol use: No  ? Drug use: No  ? Sexual activity: Yes  ?  Birth control/protection: Pill  ?Other Topics Concern  ? Not on file  ?Social History Narrative  ? Lillar attends [employer]. She is doing well.  ? Patient was adopted at 26 year of age.  ? ?Social Determinants of Health  ? ?Financial Resource Strain: Not on file  ?Food Insecurity: Not on file   ?Transportation Needs: Not on file  ?Physical Activity: Not on file  ?Stress: Not on file  ?Social Connections: Not on file  ?Intimate Partner Violence: Not on file  ? ? ?Objective:  ? ?Today's Vitals: BP 117/84   Pulse (!) 101   Temp 98.4 ?F (36.9 ?C) (Temporal)   Ht 5\' 1"  (1.549 m)   Wt 207 lb 9.6 oz (94.2 kg)   LMP 08/31/2021 (Exact Date)   SpO2 98%   BMI 39.23 kg/m?  ? ?Physical Exam ?Vitals and nursing note reviewed.  ?Constitutional:   ?   Appearance: Normal appearance. She is obese.  ?Cardiovascular:  ?   Rate and Rhythm: Normal rate and regular rhythm.  ?Pulmonary:  ?   Effort: Pulmonary effort is normal.  ?   Breath sounds: Normal breath sounds.  ?Musculoskeletal:     ?   General: Normal range of motion.  ?Skin: ?   General: Skin is warm and dry.  ?Neurological:  ?   Mental Status: She is alert.  ?Psychiatric:     ?   Mood and Affect: Mood normal.     ?   Behavior: Behavior normal.  ? ? ?Assessment & Plan:  ? ?Problem List Items Addressed This Visit   ? ?  ? Nervous and Auditory  ? Epilepsy, generalized, convulsive (HCC) - Primary  ?  reports last  seizure in 2021 - tried to reduce medicine and then stopped, but ended up having a seizure, back on Keppra qd and no seizures since. Takes Topamax for migraines. Has had a pediatric NEURO in past and would like to get established with an adult NEURO. ?  ?  ? Relevant Orders  ? Ambulatory referral to Neurology  ? ? ?Outpatient Encounter Medications as of 09/13/2021  ?Medication Sig  ? fexofenadine-pseudoephedrine (ALLEGRA-D 24) 180-240 MG per 24 hr tablet Take 1 tablet by mouth daily.  ? fluticasone (FLONASE) 50 MCG/ACT nasal spray Place 2 sprays into the nose 2 (two) times daily.  ? Levetiracetam 750 MG TB24 TAKE 2 TABLETS BY MOUTH AT  NIGHT  ? Multiple Vitamins-Minerals (MULTIVITAMIN WITH MINERALS) tablet Take 1 tablet by mouth daily.  ? NAYZILAM 5 MG/0.1ML SOLN Place 5 mg into the nose as needed. For seizures lasting longer than 5 minutes  ? ondansetron  (ZOFRAN ODT) 4 MG disintegrating tablet Take 1 tablet (4 mg total) by mouth every 8 (eight) hours as needed for nausea or vomiting.  ? topiramate (TOPAMAX) 50 MG tablet 50 mg nightly  ? [DISCONTINUED] clotrimazole-betamethasone (LOTRISONE) cream Apply 1 application topically 2 (two) times daily.  ? [DISCONTINUED] norethindrone (MICRONOR) 0.35 MG tablet Take 1 tablet (0.35 mg total) by mouth daily. Take at the same time every day. Do not miss a dose.  ? ?No facility-administered encounter medications on file as of 09/13/2021.  ? ? ?Follow-up: Return for Complete physical w/fasting labs.  ? ?Dulce Sellar, NP ?

## 2021-09-13 NOTE — Assessment & Plan Note (Addendum)
reports last seizure in 2021 - tried to reduce medicine and then stopped, but ended up having a seizure, back on Keppra qd and no seizures since. Takes Topamax for migraines. Has had a pediatric NEURO in past and would like to get established with an adult NEURO. ?

## 2021-09-13 NOTE — Patient Instructions (Addendum)
Welcome to [facility]! It was a pleasure meeting you today. ? ?As discussed, I have sent the referral to Neurology and they will call you directly. ? ?You can schedule a physical with fasting labs at your convenience today & let us know if you have any future concerns! ? ?  ? ?PLEASE NOTE: ? ?If you had any LAB tests please let us know if you have not heard back within a few days. You may see your results on MyChart before we have a chance to review them but we will give you a call once they are reviewed by [place]. If we ordered any REFERRALS today, please let us know if you have not heard from their office within the next week.  ?Let us know through MyChart if you are needing REFILLS, or have your pharmacy send [place] the request. You can also use MyChart to communicate with me or any office staff. ? ?Please try these tips to maintain a healthy lifestyle: ? ?Eat most of your calories during the day when you are active. Eliminate processed foods including packaged sweets (pies, cakes, cookies), reduce intake of potatoes, white bread, white pasta, and white rice. Look for whole grain options, oat flour or almond flour. ? ?Each meal should contain half fruits/vegetables, one quarter protein, and one quarter carbs (no bigger than a computer mouse). ? ?Cut down on sweet beverages. This includes juice, soda, and sweet tea. Also watch fruit intake, though this is a healthier sweet option, it still contains natural sugar! Limit to 3 servings daily. ? ?Drink at least 1 glass of water with each meal and aim for at least 8 glasses per day ? ?Exercise at least 150 minutes every week.  ? ?

## 2021-09-16 NOTE — Telephone Encounter (Signed)
Please call patient to schedule.

## 2021-09-16 NOTE — Telephone Encounter (Signed)
Patient already moved her appt. ?

## 2021-09-17 ENCOUNTER — Encounter: Payer: Self-pay | Admitting: Neurology

## 2021-09-20 ENCOUNTER — Encounter: Payer: No Typology Code available for payment source | Admitting: Family

## 2021-09-26 ENCOUNTER — Other Ambulatory Visit: Payer: Self-pay

## 2021-09-26 ENCOUNTER — Encounter: Payer: Self-pay | Admitting: Neurology

## 2021-09-26 ENCOUNTER — Ambulatory Visit: Payer: No Typology Code available for payment source | Admitting: Neurology

## 2021-09-26 VITALS — BP 117/80 | HR 80 | Ht 61.0 in | Wt 211.2 lb

## 2021-09-26 DIAGNOSIS — G40B09 Juvenile myoclonic epilepsy, not intractable, without status epilepticus: Secondary | ICD-10-CM | POA: Diagnosis not present

## 2021-09-26 DIAGNOSIS — G43009 Migraine without aura, not intractable, without status migrainosus: Secondary | ICD-10-CM | POA: Diagnosis not present

## 2021-09-26 MED ORDER — LEVETIRACETAM ER 750 MG PO TB24: ORAL_TABLET | ORAL | 3 refills | Status: DC

## 2021-09-26 MED ORDER — TOPIRAMATE 50 MG PO TABS: ORAL_TABLET | ORAL | 3 refills | Status: DC

## 2021-09-26 NOTE — Patient Instructions (Signed)
Good to meet you. ? ? ?Continue Levetiracetam ER 750mg : take 2 tablets every night ? ?2. Continue Topiramate 50mg  every night ? ?3. Start a daily folic acid 1mg  tablet ? ?4. Follow-up in 6 months, call for any changes ? ? ?Seizure Precautions: ?1. If medication has been prescribed for you to prevent seizures, take it exactly as directed.  Do not stop taking the medicine without talking to your doctor first, even if you have not had a seizure in a long time.  ? ?2. Avoid activities in which a seizure would cause danger to yourself or to others.  Don't operate dangerous machinery, swim alone, or climb in high or dangerous places, such as on ladders, roofs, or girders.  Do not drive unless your doctor says you may. ? ?3. If you have any warning that you may have a seizure, lay down in a safe place where you can't hurt yourself.   ? ?4.  No driving for 6 months from last seizure, as per [facility].   Please refer to the following link on the Epilepsy Foundation of America's website for more information: http://www.epilepsyfoundation.org/answerplace/Social/driving/drivingu.cfm  ? ?5.  Maintain good sleep hygiene. Avoid alcohol. ? ?6.  Notify your neurology if you are planning pregnancy or if you become pregnant. ? ?7.  Contact your doctor if you have any problems that may be related to the medicine you are taking. ? ?8.  Call 911 and bring the patient back to the ED if: ?      ? A.  The seizure lasts longer than 5 minutes.      ? B.  The patient doesn't awaken shortly after the seizure ? C.  The patient has new problems such as difficulty seeing, speaking or moving ? D.  The patient was injured during the seizure ? E.  The patient has a temperature over 102 F (39C) ? F.  The patient vomited and now is having trouble breathing ?      ? ?

## 2021-09-26 NOTE — Progress Notes (Signed)
NEUROLOGY CONSULTATION NOTE  Alexis Mills MRN: [id number] DOB: February 21, 1996  Referring provider: Dulce Sellar, NP Primary care provider: Dulce Sellar, NP  Reason for consult:  establish adult epilepsy care   Thank you for your kind referral of Alexis Mills for consultation of the above symptoms. Although her history is well known to you, please allow me to reiterate it for the purpose of our medical record. The patient was accompanied to the clinic by her husband [name] who also provides collateral information. Records and images were personally reviewed where available.   HISTORY OF PRESENT ILLNESS: This is a pleasant 26 year old right-handed woman with a history of migraines, Juvenile Myoclonic Epilepsy, presenting to establish adult epilepsy care. Records from her pediatric neurologist Dr. Devonne Doughty were reviewed. She started having recurrent generalized tonic-clonic seizures at age 26. She does not recall any prior warning to the first seizure, but since then she would have myoclonic jerks that she calls "tremors" progressing to a GTC. She was initially evaluated at [facility] and had an ambulatory and overnight inpatient EEG which were reportedly normal. She started seeing Dr. Devonne Doughty in 2014 after she had an abnormal EEG reporting a few episodes of photoparoxysmal response with sharp contoured waves during IPS mainly at 21 Hz with photoparoxysmal activity lasting 10 seconds without clinical correlate. She was started on Levetiracetam and became seizure-free for several years so medication was weaned down. In 2020, they reported 2 episodes of confusion and alteration of awareness occurring with severe headaches, no motor component. 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 LEV was weaned off until she had a seizure during her honeymoon in 2021. EEG at that time showed a  few brief clusters of generalized discharges particularly during high-frequency IPS and during drowsiness/sleep. Levetiracetam ER 1500mg  qhs was restarted with no further GTCs since 2021. She denies any staring/unresponsive episodes. The myoclonic jerks had quieted down as well, until around a month ago when she started feeling very lightheaded before going to bed. The symptoms eased up and she slept, but woke up very lightheaded the next morning and recalls having a couple of "tremors" while getting ready for work. The lightheadedness progressively worsened that she had to leave work early and called out the next day. She recalls her head hurt and she felt so foggy, this eventually improved that afternoon but she had a lingering headache similar to how she felt after her seizures in the past. She denied feeling confused, no speech/comprehension difficulties. She was probably a little more stressed out from work that time, but denies any sleep deprivation, missed medications, or alcohol use. Prior to the recent episode, she was not having any headaches on Topiramate 50mg  qhs. She denies any olfactory/gustatory hallucinations, focal numbness/tingling/weakness. No diplopia, dysarthria/dysphagia, bowel/bladder dysfunction. 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 Memory is good. She usually gets 8 hours of sleep.  Epilepsy Risk Factors:  She was adopted, but was told her mother had substance abuse issues and as a newborn she had to be in "rehab for 6 months." There is no history of febrile convulsions, CNS infections such as meningitis/encephalitis, significant traumatic brain injury, neurosurgical procedures   PAST MEDICAL HISTORY: Past Medical History:  Diagnosis Date   ADD (attention deficit disorder)    Seizures (HCC)     PAST SURGICAL HISTORY: Past Surgical History:  Procedure Laterality Date  COLPOSCOPY  09/06/2019   NO PAST SURGERIES      MEDICATIONS: Current Outpatient Medications  on File Prior to Visit  Medication Sig Dispense Refill   fexofenadine-pseudoephedrine (ALLEGRA-D 24) 180-240 MG per 24 hr tablet Take 1 tablet by mouth daily.     fluticasone (FLONASE) 50 MCG/ACT nasal spray Place 2 sprays into the nose 2 (two) times daily.     Levetiracetam 750 MG TB24 TAKE 2 TABLETS BY MOUTH AT  NIGHT 180 tablet 2   Multiple Vitamins-Minerals (MULTIVITAMIN WITH MINERALS) tablet Take 1 tablet by mouth daily.     NAYZILAM 5 MG/0.1ML SOLN Place 5 mg into the nose as needed. For seizures lasting longer than 5 minutes 2 each 1   ondansetron (ZOFRAN ODT) 4 MG disintegrating tablet Take 1 tablet (4 mg total) by mouth every 8 (eight) hours as needed for nausea or vomiting. 20 tablet 0   topiramate (TOPAMAX) 50 MG tablet 50 mg nightly 30 tablet 7   No current facility-administered medications on file prior to visit.    ALLERGIES: Allergies  Allergen Reactions   Other     Seasonal Allergies    FAMILY HISTORY: Family History  Adopted: Yes    SOCIAL HISTORY: Social History   Socioeconomic History   Marital status: Married    Spouse name: Not on file   Number of children: Not on file   Years of education: Not on file   Highest education level: Not on file  Occupational History   Occupation: Preschool teacher    Comment: [employer]  Tobacco Use   Smoking status: Never    Passive exposure: Never   Smokeless tobacco: Never  Vaping Use   Vaping Use: Never used  Substance and Sexual Activity   Alcohol use: No   Drug use: No   Sexual activity: Yes    Birth control/protection: Pill  Other Topics Concern   Not on file  Social History Narrative   Zyona attends [employer]. She is doing well.   Patient was adopted at 26 year of age.   Right handed   Social Determinants of Health   Financial Resource Strain: Not on file  Food Insecurity: Not on file  Transportation Needs: Not on file  Physical Activity: Not on file  Stress: Not  on file  Social Connections: Not on file  Intimate Partner Violence: Not on file     PHYSICAL EXAM: Vitals:   09/26/21 0841  BP: 117/80  Pulse: 80  SpO2: 99%   General: No acute distress Head:  Normocephalic/atraumatic Skin/Extremities: No rash, no edema Neurological Exam: Mental status: alert and oriented to person, place, and time, no dysarthria or aphasia, Fund of knowledge is appropriate.  Recent and remote memory are intact, 3/3 delayed recall.  Attention and concentration are normal, 5/5 WORLD backwards Cranial nerves: CN I: not tested CN II: pupils equal, round and reactive to light, visual fields intact CN III, IV, VI:  full range of motion, no nystagmus, no ptosis CN V: facial sensation intact CN VII: upper and lower face symmetric CN VIII: hearing intact to conversation Bulk & Tone: normal, no fasciculations. Motor: 5/5 throughout with no pronator drift. Sensation: intact to light touch, cold, pin, vibration sense.  No extinction to double simultaneous stimulation.  Romberg test negative Deep Tendon Reflexes: +1 throughout Cerebellar: no incoordination on finger to nose testing Gait: narrow-based and steady, able to tandem walk adequately. Tremor: none   IMPRESSION: This is a pleasant 25 year  old right-handed woman with a history of migraines, Juvenile Myoclonic Epilepsy, presenting to establish adult epilepsy care. She had been seizure-free for many years that Levetiracetam was weaned off until she had another GTC in 2021, none since restarting Levetiracetam ER 1500mg  qhs. She had an episode last month of brief myoclonic jerks with lighteadedness/fogginess, headache. It appears she had a similar episode in 2020 attributed to confusional migraine. We agreed to continue current medications for now, refills sent for Levetiracetam ER 1500mg  qhs and Topiramate 50mg  qhs. Issues in women with epilepsy discussed, start daily folic acid. [place] laws were discussed with the  patient, and she knows to stop driving after a seizure, until 6 months seizure-free. Follow-up in 6 months, call for any changes.   Thank you for allowing me to participate in the care of this patient. Please do not hesitate to call for any questions or concerns.   Patrcia Dolly, M.D.  CC: Dulce Sellar, NP

## 2021-09-30 ENCOUNTER — Encounter: Payer: Self-pay | Admitting: Family

## 2021-09-30 ENCOUNTER — Ambulatory Visit: Payer: No Typology Code available for payment source | Admitting: Family

## 2021-09-30 VITALS — BP 122/84 | HR 111 | Temp 98.4°F | Ht 61.0 in | Wt 206.1 lb

## 2021-09-30 DIAGNOSIS — J020 Streptococcal pharyngitis: Secondary | ICD-10-CM | POA: Diagnosis not present

## 2021-09-30 DIAGNOSIS — H9202 Otalgia, left ear: Secondary | ICD-10-CM

## 2021-09-30 DIAGNOSIS — H6122 Impacted cerumen, left ear: Secondary | ICD-10-CM

## 2021-09-30 LAB — POCT RAPID STREP A (OFFICE): Rapid Strep A Screen: POSITIVE — AB

## 2021-09-30 MED ORDER — AMOXICILLIN 500 MG PO CAPS: 500.0000 mg | ORAL_CAPSULE | Freq: Two times a day (BID) | ORAL | 0 refills | Status: AC

## 2021-09-30 NOTE — Progress Notes (Signed)
? ?Subjective:  ? ? ? Patient ID: Alexis Mills, female    DOB: Apr 27, 1996, 26 y.o.   MRN: [id number] ? ?Chief Complaint  ?Patient presents with  ? Nasal Congestion  ?  Pt c/o congestion, sore throat and left ear pain since 3/18. Rotating tylenol 500 mg and Ibuprofen 200 mg. Negative for covid on 3/19.  ? Fever  ?  100.0 before patient came in.   ? ?HPI: ?Ear pain - pt describes a 2 days history of Left ear pain with fullness, pressure. Denies associated discharge, itching, and dizziness  ?Sore Throat: Patient complains of sore throat. Associated symptoms include post nasal drip, sinus and nasal congestion, and sore throat.Onset of symptoms was 2 days ago, gradually worsening since that time. She is drinking moderate amounts of fluids. She has not had recent close exposure to someone with proven streptococcal pharyngitis.  ? ?Health Maintenance Due  ?Topic Date Due  ? COVID-19 Vaccine (1) Never done  ? HIV Screening  Never done  ? Hepatitis C Screening  Never done  ? TETANUS/TDAP  Never done  ? ? ?Past Medical History:  ?Diagnosis Date  ? ADD (attention deficit disorder)   ? Seizures (HCC)   ? ? ?Past Surgical History:  ?Procedure Laterality Date  ? COLPOSCOPY  09/06/2019  ? NO PAST SURGERIES    ? ? ?Outpatient Medications Prior to Visit  ?Medication Sig Dispense Refill  ? fexofenadine-pseudoephedrine (ALLEGRA-D 24) 180-240 MG per 24 hr tablet Take 1 tablet by mouth daily.    ? fluticasone (FLONASE) 50 MCG/ACT nasal spray Place 2 sprays into the nose 2 (two) times daily.    ? Levetiracetam 750 MG TB24 TAKE 2 TABLETS BY MOUTH AT  NIGHT 180 tablet 3  ? Multiple Vitamins-Minerals (MULTIVITAMIN WITH MINERALS) tablet Take 1 tablet by mouth daily.    ? NAYZILAM 5 MG/0.1ML SOLN Place 5 mg into the nose as needed. For seizures lasting longer than 5 minutes 2 each 1  ? ondansetron (ZOFRAN ODT) 4 MG disintegrating tablet Take 1 tablet (4 mg total) by mouth every 8 (eight) hours as needed for nausea or vomiting. 20 tablet  0  ? topiramate (TOPAMAX) 50 MG tablet 50 mg nightly 90 tablet 3  ? ?No facility-administered medications prior to visit.  ? ? ?Allergies  ?Allergen Reactions  ? Other   ?  Seasonal Allergies  ? ? ?   ?Objective:  ?  ?Physical Exam ?Vitals and nursing note reviewed.  ?Constitutional:   ?   Appearance: Normal appearance. She is obese.  ?HENT:  ?   Right Ear: Tympanic membrane and ear canal normal. Right ear perforated TM: ear lavage.  ?   Left Ear: There is impacted cerumen (ear canal erythematous after cleaning, TM wnl).  ?   Mouth/Throat:  ?   Mouth: Mucous membranes are moist.  ?   Pharynx: Pharyngeal swelling and posterior oropharyngeal erythema present. No oropharyngeal exudate.  ?   Tonsils: No tonsillar exudate or tonsillar abscesses.  ?Cardiovascular:  ?   Rate and Rhythm: Normal rate and regular rhythm.  ?Pulmonary:  ?   Effort: Pulmonary effort is normal.  ?   Breath sounds: Normal breath sounds.  ?Musculoskeletal:     ?   General: Normal range of motion.  ?Lymphadenopathy:  ?   Cervical: Cervical adenopathy present.  ?   Right cervical: Superficial cervical adenopathy present.  ?   Left cervical: Superficial cervical adenopathy present.  ?Skin: ?   General: Skin is  warm and dry.  ?Neurological:  ?   Mental Status: She is alert.  ?Psychiatric:     ?   Mood and Affect: Mood normal.     ?   Behavior: Behavior normal.  ? ? ?BP 122/84 (BP Location: Left Arm, Patient Position: Sitting, Cuff Size: Large)   Pulse (!) 111   Temp 98.4 ?F (36.9 ?C) (Temporal)   Ht 5\' 1"  (1.549 m)   Wt 206 lb 2 oz (93.5 kg)   LMP 08/31/2021 (Exact Date)   SpO2 97%   BMI 38.95 kg/m?  ?Wt Readings from Last 3 Encounters:  ?09/30/21 206 lb 2 oz (93.5 kg)  ?09/26/21 211 lb 3.2 oz (95.8 kg)  ?09/13/21 207 lb 9.6 oz (94.2 kg)  ? ? ?   ?Assessment & Plan:  ? ?Problem List Items Addressed This Visit   ?None ?Visit Diagnoses   ? ? Strep throat    -  Primary  ? Sending AMOX, pt advised on use & SE. Advised pt to take Ibuprofen 600mg  3  times per day for sore throat pain, swelling, and fever. Gargle with warm salt water tid. OK to use over the counter Chloraseptic spray and/or throat lozenges as needed. Drink plenty of water. ? ?Relevant Medications  ? amoxicillin (AMOXIL) 500 MG capsule  ? Other Relevant Orders  ? POCT rapid strep A (Completed)  ? Left ear pain      ? cerumen impaction, received verbal consent to remove via lavage, water/H2peroxide solution instilled, moderate amount of cerumen removed, pt tolerated well, reports less congestion in ear, TM wnl, ear canal erythematous, advised pt soreness should resolve in another hour or so, taking Ibuprofen for sore throat which will also help. ? ?Relevant Orders  ? Ear Lavage  ? ?  ? ?Meds ordered this encounter  ?Medications  ? amoxicillin (AMOXIL) 500 MG capsule  ?  Sig: Take 1 capsule (500 mg total) by mouth 2 (two) times daily for 10 days.  ?  Dispense:  20 capsule  ?  Refill:  0  ?  Order Specific Question:   Supervising Provider  ?  Answer:   ANDY, CAMILLE L [[id number]]  ? ? ?11/13/21, NP ? ?

## 2021-09-30 NOTE — Patient Instructions (Addendum)
It was very nice to see you today! ? ?Your rapid strep test is positive, and I have sent Amoxicillin to your pharmacy.  ?There is no infection in your left ear after cleaning it out a little. ?Take up to 600mg  of Ibuprofen 3 times per day after eating something will help reduce swelling & pain in your throat, help ear pain, and fever. ?Warm salt water gargles 3 times day. Can suck on throat lozenges 3-4/day. ?Drink at least 64 oz water daily. ? ?Let me know if still not feeling better! ? ? ? ?PLEASE NOTE: ? ?If you had any lab tests please let us know if you have not heard back within a few days. You may see your results on MyChart before we have a chance to review them but we will give you a call once they are reviewed by [place]. If we ordered any referrals today, please let us know if you have not heard from their office within the next week.  ? ?Please try these tips to maintain a healthy lifestyle: ? ?Eat most of your calories during the day when you are active. Eliminate processed foods including packaged sweets (pies, cakes, cookies), reduce intake of potatoes, white bread, white pasta, and white rice. Look for whole grain options, oat flour or almond flour. ? ?Each meal should contain half fruits/vegetables, one quarter protein, and one quarter carbs (no bigger than a computer mouse). ? ?Cut down on sweet beverages. This includes juice, soda, and sweet tea. Also watch fruit intake, though this is a healthier sweet option, it still contains natural sugar! Limit to 3 servings daily. ? ?Drink at least 1 glass of water with each meal and aim for at least 8 glasses per day ? ?Exercise at least 150 minutes every week.  ? ?

## 2021-10-03 ENCOUNTER — Ambulatory Visit (INDEPENDENT_AMBULATORY_CARE_PROVIDER_SITE_OTHER): Payer: No Typology Code available for payment source | Admitting: Family

## 2021-10-03 VITALS — BP 116/78 | HR 76 | Temp 98.2°F | Ht 61.0 in | Wt 208.2 lb

## 2021-10-03 DIAGNOSIS — Z0000 Encounter for general adult medical examination without abnormal findings: Secondary | ICD-10-CM | POA: Diagnosis not present

## 2021-10-03 LAB — CBC WITH DIFFERENTIAL/PLATELET
Basophils Absolute: 0 10*3/uL (ref 0.0–0.1)
Basophils Relative: 0.7 % (ref 0.0–3.0)
Eosinophils Absolute: 0.1 10*3/uL (ref 0.0–0.7)
Eosinophils Relative: 1.7 % (ref 0.0–5.0)
HCT: 40.9 % (ref 36.0–46.0)
Hemoglobin: 13.5 g/dL (ref 12.0–15.0)
Lymphocytes Relative: 29.4 % (ref 12.0–46.0)
Lymphs Abs: 2 10*3/uL (ref 0.7–4.0)
MCHC: 33 g/dL (ref 30.0–36.0)
MCV: 83.3 fl (ref 78.0–100.0)
Monocytes Absolute: 0.3 10*3/uL (ref 0.1–1.0)
Monocytes Relative: 5.1 % (ref 3.0–12.0)
Neutro Abs: 4.2 10*3/uL (ref 1.4–7.7)
Neutrophils Relative %: 63.1 % (ref 43.0–77.0)
Platelets: 259 10*3/uL (ref 150.0–400.0)
RBC: 4.92 Mil/uL (ref 3.87–5.11)
RDW: 13.4 % (ref 11.5–15.5)
WBC: 6.6 10*3/uL (ref 4.0–10.5)

## 2021-10-03 LAB — COMPREHENSIVE METABOLIC PANEL
ALT: 32 U/L (ref 0–35)
AST: 17 U/L (ref 0–37)
Albumin: 4.3 g/dL (ref 3.5–5.2)
Alkaline Phosphatase: 49 U/L (ref 39–117)
BUN: 11 mg/dL (ref 6–23)
CO2: 28 mEq/L (ref 19–32)
Calcium: 9.1 mg/dL (ref 8.4–10.5)
Chloride: 104 mEq/L (ref 96–112)
Creatinine, Ser: 0.82 mg/dL (ref 0.40–1.20)
GFR: 99.15 mL/min (ref >60.00)
Glucose, Bld: 89 mg/dL (ref 70–99)
Potassium: 4.2 mEq/L (ref 3.5–5.1)
Sodium: 139 mEq/L (ref 135–145)
Total Bilirubin: 0.4 mg/dL (ref 0.2–1.2)
Total Protein: 7 g/dL (ref 6.0–8.3)

## 2021-10-03 LAB — LIPID PANEL
Cholesterol: 153 mg/dL (ref 0–200)
HDL: 31.2 mg/dL — ABNORMAL LOW (ref >39.00)
LDL Cholesterol: 85 mg/dL (ref 0–99)
NonHDL: 121.59
Total CHOL/HDL Ratio: 5
Triglycerides: 181 mg/dL — ABNORMAL HIGH (ref 0.0–149.0)
VLDL: 36.2 mg/dL (ref 0.0–40.0)

## 2021-10-03 LAB — TSH: TSH: 2.89 u[IU]/mL (ref 0.35–5.50)

## 2021-10-03 NOTE — Patient Instructions (Signed)
It was very nice to see you today!  Go to the lab for blood work today.   PLEASE NOTE:  If you had any lab tests please let us know if you have not heard back within a few days. You may see your results on MyChart before we have a chance to review them but we will give you a call once they are reviewed by us. If we ordered any referrals today, please let us know if you have not heard from their office within the next week.   Please try these tips to maintain a healthy lifestyle:  Eat most of your calories during the day when you are active. Eliminate processed foods including packaged sweets (pies, cakes, cookies), reduce intake of potatoes, white bread, white pasta, and white rice. Look for whole grain options, oat flour or almond flour.  Each meal should contain half fruits/vegetables, one quarter protein, and one quarter carbs (no bigger than a computer mouse).  Cut down on sweet beverages. This includes juice, soda, and sweet tea. Also watch fruit intake, though this is a healthier sweet option, it still contains natural sugar! Limit to 3 servings daily.  Drink at least 1 glass of water with each meal and aim for at least 8 glasses per day  Exercise at least 150 minutes every week.   

## 2021-10-03 NOTE — Progress Notes (Signed)
?Phone [phone] ?  ?Subjective:  ? ?Patient is a 26 y.o. female presenting for annual physical.   ? ?Chief Complaint  ?Patient presents with  ? Annual Exam  ?  Fasting W/ Labs  ? ? ?See problem oriented charting- ?ROS- full  review of systems was completed and negative ? ?The following were reviewed and entered/updated in epic: ?Past Medical History:  ?Diagnosis Date  ? ADD (attention deficit disorder)   ? Seizures ([place])   ? ?Patient Active Problem List  ? Diagnosis Date Noted  ? Atopic dermatitis 01/10/2019  ? Migraine variants, not intractable 09/14/2018  ? 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 ?Past Surgical History:  ?Procedure Laterality Date  ? COLPOSCOPY  09/06/2019  ? NO PAST SURGERIES    ? ?Family History  ?Adopted: Yes  ? ?Medications- reviewed and updated ?Current Outpatient Medications  ?Medication Sig Dispense Refill  ? amoxicillin (AMOXIL) 500 MG capsule Take 1 capsule (500 mg total) by mouth 2 (two) times daily for 10 days. 20 capsule 0  ? fexofenadine-pseudoephedrine (ALLEGRA-D 24) 180-240 MG per 24 hr tablet Take 1 tablet by mouth daily.    ? fluticasone (FLONASE) 50 MCG/ACT nasal spray Place 2 sprays into the nose 2 (two) times daily.    ? Levetiracetam 750 MG TB24 TAKE 2 TABLETS BY MOUTH AT  NIGHT 180 tablet 3  ? Multiple Vitamins-Minerals (MULTIVITAMIN WITH MINERALS) tablet Take 1 tablet by mouth daily.    ? NAYZILAM 5 MG/0.1ML SOLN Place 5 mg into the nose as needed. For seizures lasting longer than 5 minutes 2 each 1  ? ondansetron (ZOFRAN ODT) 4 MG disintegrating tablet Take 1 tablet (4 mg total) by mouth every 8 (eight) hours as needed for nausea or vomiting. 20 tablet 0  ? topiramate (TOPAMAX) 50 MG tablet 50 mg nightly 90 tablet 3  ? ?No current facility-administered medications for this visit.  ? ?Allergies-reviewed and  updated ?Allergies  ?Allergen Reactions  ? Other   ?  Seasonal Allergies  ? ? ?Social History  ? ?Social History Narrative  ? Alexis Mills attends [employer]. She is doing well.  ? Patient was adopted at 26 year of age.  ? Right handed  ? ?Objective  ?Objective:  ?BP 116/78 (BP Location: Right Arm, Patient Position: Sitting, Cuff Size: Large)   Pulse 76   Temp 98.2 ?F (36.8 ?C) (Temporal)   Ht 5\' 1"  (1.549 m)   Wt 208 lb 3.2 oz (94.4 kg)   LMP 10/02/2021 (Exact Date)   SpO2 99%   BMI 39.34 kg/m?  ?Physical Exam ?Vitals and nursing note reviewed.  ?Constitutional:   ?   Appearance: Normal appearance.  ?HENT:  ?   Head: Normocephalic.  ?   Right Ear: Tympanic membrane normal.  ?   Left Ear: Tympanic membrane normal.  ?   Nose: Nose normal.  ?   Mouth/Throat:  ?   Mouth: Mucous membranes are moist.  ?Eyes:  ?   Pupils: Pupils are equal, round, and reactive to light.  ?Cardiovascular:  ?   Rate and Rhythm: Normal rate and regular rhythm.  ?Pulmonary:  ?   Effort: Pulmonary effort is normal.  ?   Breath sounds: Normal breath sounds.  ?Musculoskeletal:     ?   General: Normal range of motion.  ?   Cervical back: Normal range of motion.  ?Lymphadenopathy:  ?  Cervical: No cervical adenopathy.  ?Skin: ?   General: Skin is warm and dry.  ?Neurological:  ?   Mental Status: She is alert.  ?Psychiatric:     ?   Mood and Affect: Mood normal.     ?   Behavior: Behavior normal.  ? ?  ?Assessment and Plan  ? ?Health Maintenance counseling: ?1. Anticipatory guidance: Patient counseled regarding regular dental exams q6 months, eye exams,  avoiding smoking and second hand smoke, limiting alcohol to 1 beverage per day, no illicit drugs.   ?2. Risk factor reduction:  Advised patient of need for regular exercise and diet rich with fruits and vegetables to reduce risk of heart attack and stroke. ?Wt Readings from Last 3 Encounters:  ?10/03/21 208 lb 3.2 oz (94.4 kg)  ?09/30/21 206 lb 2 oz (93.5 kg)  ?09/26/21 211 lb 3.2  oz (95.8 kg)  ? ?3. Immunizations/screenings/ancillary studies ?Immunization History  ?Administered Date(s) Administered  ? HPV 9-valent 12/12/2015, 09/06/2019, 01/12/2020  ? Influenza Inj Mdck Quad Pf 04/23/2018  ? Influenza,inj,Quad PF,6+ Mos 07/14/2019, 09/13/2021  ? ?Health Maintenance Due  ?Topic Date Due  ? COVID-19 Vaccine (1) Never done  ? HIV Screening  Never done  ? Hepatitis C Screening  Never done  ? TETANUS/TDAP  Never done  ?  ?4. Cervical cancer screening: 2022 ?5. Skin cancer screening- advised regular sunscreen use. Denies worrisome, changing, or new skin lesions.  ?6. Birth control/STD check: N/A ?7. Smoking associated screening: non- smoker ?8. Alcohol screening: rare ? ?Problem List Items Addressed This Visit   ?None ?Visit Diagnoses   ? ? Annual physical exam    -  Primary  ? Relevant Orders  ? Comprehensive metabolic panel  ? TSH  ? Lipid panel  ? CBC with Differential/Platelet  ? ?  ? ? ?Recommended follow up: Return for  any future concerns. ?Future Appointments  ?Date Time Provider [place]  ?04/07/2022  2:00 PM Cameron Sprang, MD [facility] None  ? ? ?Lab/Order associations:fasting ? ?  ?Jeanie Sewer, NP ? ? ?

## 2021-10-06 NOTE — Progress Notes (Signed)
Hi Alexis Mills, ? ?Glucose, electrolytes, blood count, thyroid, liver & kidney function all normal. ? ?Just your triglycerides are a little high, this is usually from eating too many high carb foods. Be careful to reduce any fried foods, alcohol, nonnutritional snacks e.g. chips/cookies,pies, cakes and candies.  ?Increase fruits/vegetables/fiber.   ? ?Also your good cholesterol, HDL, is a little low, this can improve with eating more fish or taking a fish or krill oil supplement, up to 2 or 3 grams per day. Also exercise will improve this number. ?Continue or restart an exercise routine, shooting for 72min 5-7days per week. ? ?Any questions, let me know! ?

## 2021-11-25 ENCOUNTER — Encounter: Payer: Self-pay | Admitting: Family

## 2021-11-25 ENCOUNTER — Telehealth (INDEPENDENT_AMBULATORY_CARE_PROVIDER_SITE_OTHER): Payer: No Typology Code available for payment source | Admitting: Family

## 2021-11-25 VITALS — Ht 61.0 in | Wt 208.0 lb

## 2021-11-25 DIAGNOSIS — R21 Rash and other nonspecific skin eruption: Secondary | ICD-10-CM | POA: Diagnosis not present

## 2021-11-25 DIAGNOSIS — H10231 Serous conjunctivitis, except viral, right eye: Secondary | ICD-10-CM

## 2021-11-25 MED ORDER — CROMOLYN SODIUM 4 % OP SOLN: 1.0000 [drp] | Freq: Four times a day (QID) | OPHTHALMIC | 0 refills | Status: AC

## 2021-11-25 MED ORDER — HYDROXYZINE HCL 10 MG PO TABS: 10.0000 mg | ORAL_TABLET | Freq: Three times a day (TID) | ORAL | 0 refills | Status: DC | PRN

## 2021-11-25 MED ORDER — FAMOTIDINE 20 MG PO TABS: 20.0000 mg | ORAL_TABLET | Freq: Two times a day (BID) | ORAL | 0 refills | Status: DC

## 2021-11-25 NOTE — Progress Notes (Signed)
? ? ?MyChart Video Visit ? ? ? ?Virtual Visit via Video Note  ? ?This visit type was conducted due to national recommendations for restrictions regarding the COVID-19 Pandemic (e.g. social distancing) in an effort to limit this patient's exposure and mitigate transmission in our community. This patient is at least at moderate risk for complications without adequate follow up. This format is felt to be most appropriate for this patient at this time. Physical exam was limited by quality of the video and audio technology used for the visit. CMA was able to get the patient set up on a video visit. ? ?Patient location: Home. Patient and provider in visit ?Provider location: Office ? ?I discussed the limitations of evaluation and management by telemedicine and the availability of in person appointments. The patient expressed understanding and agreed to proceed. ? ?Visit Date: 11/25/2021 ? ?Today's healthcare provider: Jeanie Sewer, NP  ? ? ? ?Subjective:  ? ? Patient ID: Alexis Mills, female    DOB: 10-02-1995, 25 y.o.   MRN: [id number] ? ?Chief Complaint  ?Patient presents with  ? rash   ?  Pt c/o redness, burning rash on her face, primarily the right side. Pt states her eyes has been itching, redness and burning since this morning when she woke up. Has tried allegra and hydrocortisone cream which made it burn even more.   ? ? ?HPI ?Dermatitis: Patient complains of a rash. Symptoms began 1 day ago. Patient describes the rash as red, localized. Characteristics of rash and associated history: Similar rash in the past? no, Is rash pruritic?  yes, Is rash painful?  burning pain, like a sunburn. Patient's previous dermatologic history includes  none . Medications currently using: hydrocortisone and Allegra . Environmental exposures or allergies: pollens ?Eye irritation:  Pt complains of pain, itching, watery discharge, matting, mild redness. Pt denies thick discharge. Symptoms started 1 days ago. Symptoms are  located in  right eyes. Pt has not been exposed to someone with confirmed conjunctivitis. Pt does not also have other sinus symptoms. ? ?Assessment & Plan:  ? ?Problem List Items Addressed This Visit   ?None ?Visit Diagnoses   ? ? Skin rash    -  Primary  ? appears as a contact dermatitis. pt states she was outside some yesterday, but not doing any yard work, does not remember a bug bite, has not used any new facial products. She did touch a new tick/flea dog collar prior to using on her dog and unsure if that may be cause.  Sending Hydroxyzine for itching, advised ok to use with her Allegra. Use a OTC hypoallergenic cream on face.  ? ?Relevant Medications  ? hydrOXYzine (ATARAX) 10 MG tablet  ? famotidine (PEPCID) 20 MG tablet  ? Serous conjunctivitis of right eye      ? Relevant Medications  ? cromolyn (OPTICROM) 4 % ophthalmic solution  ? ?  ? ?Past Medical History:  ?Diagnosis Date  ? ADD (attention deficit disorder)   ? Seizures ([place])   ? ? ?Past Surgical History:  ?Procedure Laterality Date  ? COLPOSCOPY  09/06/2019  ? NO PAST SURGERIES    ? ? ?Outpatient Medications Prior to Visit  ?Medication Sig Dispense Refill  ? fexofenadine-pseudoephedrine (ALLEGRA-D 24) 180-240 MG per 24 hr tablet Take 1 tablet by mouth daily.    ? fluticasone (FLONASE) 50 MCG/ACT nasal spray Place 2 sprays into the nose 2 (two) times daily.    ? Levetiracetam 750 MG TB24 TAKE 2 TABLETS BY  MOUTH AT  NIGHT 180 tablet 3  ? Multiple Vitamins-Minerals (MULTIVITAMIN WITH MINERALS) tablet Take 1 tablet by mouth daily.    ? NAYZILAM 5 MG/0.1ML SOLN Place 5 mg into the nose as needed. For seizures lasting longer than 5 minutes 2 each 1  ? ondansetron (ZOFRAN ODT) 4 MG disintegrating tablet Take 1 tablet (4 mg total) by mouth every 8 (eight) hours as needed for nausea or vomiting. 20 tablet 0  ? topiramate (TOPAMAX) 50 MG tablet 50 mg nightly 90 tablet 3  ? ?No facility-administered medications prior to visit.  ? ? ?Allergies  ?Allergen  Reactions  ? Other   ?  Seasonal Allergies  ? ? ?   ?Objective:  ?  ? ?Physical Exam ?Vitals and nursing note reviewed.  ?Constitutional:   ?   General: She is not in acute distress. ?   Appearance: Normal appearance.  ?HENT:  ?   Head: Normocephalic.  ?Pulmonary:  ?   Effort: No respiratory distress.  ?Musculoskeletal:  ?   Cervical back: Normal range of motion.  ?Skin: ?   General: Skin is dry.  ?   Coloration: Skin is not pale. Erythema noted with pinpoint bumps on right and left side of face. Discharge noted in right eye, mile injection noted via video in sclera but none in conjunctiva. ?Neurological:  ?   Mental Status: She is alert and oriented to person, place, and time.  ?Psychiatric:     ?   Mood and Affect: Mood normal.  ? ?Ht 5\' 1"  (1.549 m)   Wt 208 lb (94.3 kg)   LMP 11/02/2021 (Approximate)   BMI 39.30 kg/m?  ? ?Wt Readings from Last 3 Encounters:  ?11/25/21 208 lb (94.3 kg)  ?10/03/21 208 lb 3.2 oz (94.4 kg)  ?09/30/21 206 lb 2 oz (93.5 kg)  ? ? ?I discussed the assessment and treatment plan with the patient. The patient was provided an opportunity to ask questions and all were answered. The patient agreed with the plan and demonstrated an understanding of the instructions. ?  ?The patient was advised to call back or seek an in-person evaluation if the symptoms worsen or if the condition fails to improve as anticipated. ? ?I provided 23 minutes of face-to-face time during this encounter. ? ?Jeanie Sewer, NP ?[place] ?[phone] (phone) ?[phone] (fax) ? ?[facility]  ?

## 2021-11-29 ENCOUNTER — Other Ambulatory Visit: Payer: Self-pay | Admitting: Family

## 2021-11-29 DIAGNOSIS — R21 Rash and other nonspecific skin eruption: Secondary | ICD-10-CM

## 2022-01-02 ENCOUNTER — Encounter (HOSPITAL_BASED_OUTPATIENT_CLINIC_OR_DEPARTMENT_OTHER): Payer: Self-pay | Admitting: Emergency Medicine

## 2022-01-02 ENCOUNTER — Encounter: Payer: Self-pay | Admitting: Neurology

## 2022-01-02 ENCOUNTER — Other Ambulatory Visit: Payer: Self-pay

## 2022-01-02 ENCOUNTER — Emergency Department (HOSPITAL_BASED_OUTPATIENT_CLINIC_OR_DEPARTMENT_OTHER): Payer: PRIVATE HEALTH INSURANCE

## 2022-01-02 ENCOUNTER — Emergency Department (HOSPITAL_BASED_OUTPATIENT_CLINIC_OR_DEPARTMENT_OTHER)
Admission: EM | Admit: 2022-01-02 | Discharge: 2022-01-02 | Disposition: A | Discharge status: Home / Self Care | Payer: PRIVATE HEALTH INSURANCE | Attending: Emergency Medicine | Admitting: Emergency Medicine

## 2022-01-02 ENCOUNTER — Other Ambulatory Visit (HOSPITAL_BASED_OUTPATIENT_CLINIC_OR_DEPARTMENT_OTHER): Payer: Self-pay

## 2022-01-02 DIAGNOSIS — R42 Dizziness and giddiness: Secondary | ICD-10-CM | POA: Insufficient documentation

## 2022-01-02 LAB — COMPREHENSIVE METABOLIC PANEL
ALT: 57 U/L — ABNORMAL HIGH (ref 0–44)
AST: 26 U/L (ref 15–41)
Albumin: 4.7 g/dL (ref 3.5–5.0)
Alkaline Phosphatase: 52 U/L (ref 38–126)
Anion gap: 9 (ref 5–15)
BUN: 11 mg/dL (ref 6–20)
CO2: 25 mmol/L (ref 22–32)
Calcium: 9.6 mg/dL (ref 8.9–10.3)
Chloride: 103 mmol/L (ref 98–111)
Creatinine, Ser: 0.84 mg/dL (ref 0.44–1.00)
GFR, Estimated: >60 mL/min (ref >60)
Glucose, Bld: 85 mg/dL (ref 70–99)
Potassium: 3.7 mmol/L (ref 3.5–5.1)
Sodium: 137 mmol/L (ref 135–145)
Total Bilirubin: 0.5 mg/dL (ref 0.3–1.2)
Total Protein: 7.4 g/dL (ref 6.5–8.1)

## 2022-01-02 LAB — CBC WITH DIFFERENTIAL/PLATELET
Abs Immature Granulocytes: 0.04 10*3/uL (ref 0.00–0.07)
Basophils Absolute: 0.1 10*3/uL (ref 0.0–0.1)
Basophils Relative: 0 %
Eosinophils Absolute: 0.1 10*3/uL (ref 0.0–0.5)
Eosinophils Relative: 1 %
HCT: 43.7 % (ref 36.0–46.0)
Hemoglobin: 13.9 g/dL (ref 12.0–15.0)
Immature Granulocytes: 0 %
Lymphocytes Relative: 12 %
Lymphs Abs: 1.4 10*3/uL (ref 0.7–4.0)
MCH: 27 pg (ref 26.0–34.0)
MCHC: 31.8 g/dL (ref 30.0–36.0)
MCV: 84.9 fL (ref 80.0–100.0)
Monocytes Absolute: 0.5 10*3/uL (ref 0.1–1.0)
Monocytes Relative: 4 %
Neutro Abs: 9.6 10*3/uL — ABNORMAL HIGH (ref 1.7–7.7)
Neutrophils Relative %: 83 %
Platelets: 259 10*3/uL (ref 150–400)
RBC: 5.15 MIL/uL — ABNORMAL HIGH (ref 3.87–5.11)
RDW: 12.9 % (ref 11.5–15.5)
WBC: 11.6 10*3/uL — ABNORMAL HIGH (ref 4.0–10.5)
nRBC: 0 % (ref 0.0–0.2)

## 2022-01-02 LAB — PREGNANCY, URINE: Preg Test, Ur: NEGATIVE

## 2022-01-02 MED ORDER — MECLIZINE HCL 25 MG PO TABS: 25.0000 mg | ORAL_TABLET | Freq: Three times a day (TID) | ORAL | 0 refills | Status: DC | PRN

## 2022-01-02 MED ORDER — MECLIZINE HCL 25 MG PO TABS
25.0000 mg | ORAL_TABLET | Freq: Once | ORAL | Status: AC
  Administered 2022-01-02: 25 mg via ORAL
  Filled 2022-01-02: qty 1

## 2022-01-02 NOTE — ED Triage Notes (Signed)
Patient arrives ambulatory by POV c/o lightheaded, dizziness and feels like the room is spinning. States symptoms started suddenly this morning. Reports hx of seizures and has not missed any doses.

## 2022-01-02 NOTE — ED Notes (Signed)
Discharge instructions, follow up care, and prescription\ reviewed and explained, pt verbalized understanding. Pt caox4 and ambulatory on departure.  

## 2022-01-02 NOTE — Discharge Instructions (Signed)
Your evaluation today has been reassuring.  Dizziness could be related to an inner ear issue also, continue taking your allergy medications and if needed you can take an over-the-counter decongestant.  Use meclizine as needed for dizziness.  Anytime you are experiencing symptoms that could be a prodrome to your seizures you should not drive.  Please call Dr. Rosalyn Gess office to let her know about your symptoms.  If you develop worsening dizziness, vision changes, numbness, weakness, severe headache or have seizures return for reevaluation.

## 2022-01-03 ENCOUNTER — Other Ambulatory Visit: Payer: Self-pay | Admitting: Neurology

## 2022-01-05 MED ORDER — TOPIRAMATE 50 MG PO TABS: ORAL_TABLET | ORAL | 3 refills | Status: DC

## 2022-01-07 ENCOUNTER — Encounter: Payer: Self-pay | Admitting: Family

## 2022-01-07 ENCOUNTER — Other Ambulatory Visit: Payer: Self-pay

## 2022-01-07 DIAGNOSIS — G40B09 Juvenile myoclonic epilepsy, not intractable, without status epilepticus: Secondary | ICD-10-CM

## 2022-01-07 DIAGNOSIS — R42 Dizziness and giddiness: Secondary | ICD-10-CM

## 2022-01-14 ENCOUNTER — Ambulatory Visit (HOSPITAL_COMMUNITY)
Admission: RE | Admit: 2022-01-14 | Discharge: 2022-01-14 | Disposition: A | Discharge status: Home / Self Care | Payer: PRIVATE HEALTH INSURANCE | Source: Ambulatory Visit | Attending: Neurology | Admitting: Neurology

## 2022-01-14 DIAGNOSIS — G40B09 Juvenile myoclonic epilepsy, not intractable, without status epilepticus: Secondary | ICD-10-CM | POA: Insufficient documentation

## 2022-01-14 DIAGNOSIS — R42 Dizziness and giddiness: Secondary | ICD-10-CM | POA: Diagnosis not present

## 2022-01-14 NOTE — Progress Notes (Signed)
Outpatient EEG complete - results pending.  

## 2022-01-17 NOTE — Procedures (Signed)
ELECTROENCEPHALOGRAM REPORT  Date of Study: 01/14/2022  Patient's Name: Alexis Mills MRN: [id number] Date of Birth: [birth year]  Referring Provider: Dr. Patrcia Dolly  Clinical History: This is a 26 year old woman with a history of epilepsy with new symptoms of lightheadedness and fogginess. EEG for classification.  Medications: Topamax Keppra  Technical Summary: A multichannel digital EEG recording measured by the international 10-20 system with electrodes applied with paste and impedances below 5000 ohms performed in our laboratory with EKG monitoring in an awake and drowsy patient.  Hyperventilation and photic stimulation were performed.  The digital EEG was referentially recorded, reformatted, and digitally filtered in a variety of bipolar and referential montages for optimal display.    Description: The patient is awake and drowsy during the recording.  During maximal wakefulness, there is a symmetric, medium voltage 10 Hz posterior dominant rhythm that attenuates with eye opening.  The record is symmetric.  During drowsiness, there is a slight increase in theta slowing of the background with slow rolling eye movements seen.  Sleep was not captured.  Hyperventilation and photic stimulation did not elicit any abnormalities.  There were no epileptiform discharges or electrographic seizures seen.    EKG lead was unremarkable.  Impression: This awake and drowsy EEG is normal.    Clinical Correlation: A normal EEG does not exclude a clinical diagnosis of epilepsy.  If further clinical questions remain, prolonged EEG may be helpful.  Clinical correlation is advised.   Patrcia Dolly, M.D.

## 2022-01-20 ENCOUNTER — Encounter: Payer: Self-pay | Admitting: Neurology

## 2022-04-07 ENCOUNTER — Ambulatory Visit: Payer: No Typology Code available for payment source | Admitting: Neurology

## 2022-04-07 ENCOUNTER — Encounter: Payer: Self-pay | Admitting: Neurology

## 2022-04-07 ENCOUNTER — Encounter: Payer: Self-pay | Admitting: *Deleted

## 2022-04-07 DIAGNOSIS — Z029 Encounter for administrative examinations, unspecified: Secondary | ICD-10-CM

## 2022-05-01 NOTE — Telephone Encounter (Signed)
error 

## 2022-05-27 ENCOUNTER — Telehealth: Payer: No Typology Code available for payment source

## 2022-06-26 ENCOUNTER — Encounter: Payer: Self-pay | Admitting: *Deleted

## 2022-07-18 ENCOUNTER — Telehealth: Payer: No Typology Code available for payment source | Admitting: Physician Assistant

## 2022-07-18 DIAGNOSIS — H109 Unspecified conjunctivitis: Secondary | ICD-10-CM | POA: Diagnosis not present

## 2022-07-18 MED ORDER — POLYMYXIN B-TRIMETHOPRIM 10000-0.1 UNIT/ML-% OP SOLN: 1.0000 [drp] | OPHTHALMIC | 0 refills | Status: DC

## 2022-07-18 NOTE — Progress Notes (Signed)

## 2022-10-13 ENCOUNTER — Telehealth: Payer: Self-pay | Admitting: Neurology

## 2022-10-13 ENCOUNTER — Other Ambulatory Visit: Payer: Self-pay

## 2022-10-13 MED ORDER — LEVETIRACETAM ER 750 MG PO TB24: ORAL_TABLET | ORAL | 3 refills | Status: DC

## 2022-10-13 MED ORDER — TOPIRAMATE 50 MG PO TABS
ORAL_TABLET | ORAL | 3 refills | Status: DC
Start: 2022-10-13 — End: 2022-11-28

## 2022-10-13 NOTE — Telephone Encounter (Signed)
Patient made an appt for a f/u and refills. She needs refills on topiramate 50mg  and her levetiracetam 750mg   [facility] She would like a call to make sure she is able to get refills

## 2022-10-20 ENCOUNTER — Ambulatory Visit: Payer: No Typology Code available for payment source | Admitting: Family

## 2022-10-20 ENCOUNTER — Encounter: Payer: Self-pay | Admitting: Family

## 2022-10-20 VITALS — BP 121/85 | HR 81 | Temp 97.6°F | Ht 61.0 in | Wt 209.2 lb

## 2022-10-20 DIAGNOSIS — J029 Acute pharyngitis, unspecified: Secondary | ICD-10-CM | POA: Diagnosis not present

## 2022-10-20 DIAGNOSIS — J020 Streptococcal pharyngitis: Secondary | ICD-10-CM | POA: Diagnosis not present

## 2022-10-20 LAB — POCT INFLUENZA A/B
Influenza A, POC: NEGATIVE
Influenza B, POC: NEGATIVE

## 2022-10-20 LAB — POCT RAPID STREP A (OFFICE): Rapid Strep A Screen: POSITIVE — AB

## 2022-10-20 LAB — POC COVID19 BINAXNOW: SARS Coronavirus 2 Ag: NEGATIVE

## 2022-10-20 MED ORDER — AMOXICILLIN 500 MG PO CAPS: 500.0000 mg | ORAL_CAPSULE | Freq: Two times a day (BID) | ORAL | 0 refills | Status: AC

## 2022-10-20 NOTE — Progress Notes (Signed)
Patient ID: Alexis Mills, female    DOB: 30-Oct-1995, 27 y.o.   MRN: [id number]  Chief Complaint  Patient presents with   Sinus Problem    sx for 2d    HPI:      URI sx:  Pt c/o nasal congestion, sore throat, headaches, dizziness and Cough.  since Saturday. Denies any fever. Pt has tried mucinex. Covid negative Saturday.       Assessment & Plan:  1. Sore throat - rapid covid & flu neg.  - POC COVID-19 - POCT Influenza A/B  2. 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 Sending AMOX, advised again on use & SE, ok to take 600mg  Ibuprofen tid prn for pain, fever, headache. Drink plenty of fluids.  - POCT rapid strep A - amoxicillin (AMOXIL) 500 MG capsule; Take 1 capsule (500 mg total) by mouth 2 (two) times daily for 10 days.  Dispense: 20 capsule; Refill: 0  Subjective:    Outpatient Medications Prior to Visit  Medication Sig Dispense Refill   famotidine (PEPCID) 20 MG tablet Take 1 tablet (20 mg total) by mouth 2 (two) times daily. For allergic reaction. (Patient not taking: Reported on 01/02/2022) 14 tablet 0   fexofenadine-pseudoephedrine (ALLEGRA-D 24) 180-240 MG per 24 hr tablet Take 1 tablet by mouth daily.     fluticasone (FLONASE) 50 MCG/ACT nasal spray Place 2 sprays into the nose 2 (two) times daily.     hydrOXYzine (ATARAX) 10 MG tablet Take 1 tablet (10 mg total) by mouth 3 (three) times daily as needed for itching. 30 tablet 0   levETIRAcetam (KEPPRA XR) 750 MG 24 hr tablet TAKE 2 TABLETS BY MOUTH AT  NIGHT 180 tablet 3   meclizine (ANTIVERT) 25 MG tablet Take 1 tablet (25 mg total) by mouth 3 (three) times daily as needed for dizziness. 30 tablet 0   Multiple Vitamins-Minerals (MULTIVITAMIN WITH MINERALS) tablet Take 1 tablet by mouth daily.     NAYZILAM 5 MG/0.1ML SOLN Place 5 mg into the nose as needed. For seizures lasting longer than 5 minutes 2 each 1   ondansetron (ZOFRAN ODT) 4 MG disintegrating tablet Take 1 tablet (4  mg total) by mouth every 8 (eight) hours as needed for nausea or vomiting. 20 tablet 0   topiramate (TOPAMAX) 50 MG tablet Take 1 tablet twice a day 180 tablet 3   trimethoprim-polymyxin b (POLYTRIM) ophthalmic solution Place 1 drop into the left eye every 4 (four) hours. X 5 days 10 mL 0   No facility-administered medications prior to visit.   Past Medical History:  Diagnosis Date   ADD (attention deficit disorder)    Seizures    Past Surgical History:  Procedure Laterality Date   COLPOSCOPY  09/06/2019   NO PAST SURGERIES     Allergies  Allergen Reactions   Other     Seasonal Allergies      Objective:    Physical Exam Vitals and nursing note reviewed.  Constitutional:      Appearance: Normal appearance. She is not ill-appearing.     Interventions: Face mask in place.  HENT:     Right Ear: Tympanic membrane and ear canal normal.     Left Ear: Tympanic membrane and ear canal normal.     Nose:     Right Sinus: No frontal sinus tenderness.     Left Sinus: No frontal sinus tenderness.     Mouth/Throat:  Mouth: Mucous membranes are moist.     Pharynx: Posterior oropharyngeal erythema present. No pharyngeal swelling, oropharyngeal exudate or uvula swelling.     Tonsils: No tonsillar exudate or tonsillar abscesses.  Cardiovascular:     Rate and Rhythm: Normal rate and regular rhythm.  Pulmonary:     Effort: Pulmonary effort is normal.     Breath sounds: Normal breath sounds.  Musculoskeletal:        General: Normal range of motion.  Lymphadenopathy:     Head:     Right side of head: No submental, submandibular, preauricular, posterior auricular or occipital adenopathy.     Left side of head: No submental, submandibular, preauricular, posterior auricular or occipital adenopathy.     Cervical: Cervical adenopathy present.     Right cervical: Superficial cervical adenopathy present.     Left cervical: Superficial cervical adenopathy present.  Skin:    General: Skin is  warm and dry.  Neurological:     Mental Status: She is alert.  Psychiatric:        Mood and Affect: Mood normal.        Behavior: Behavior normal.    BP 121/85 (BP Location: Left Arm, Patient Position: Sitting, Cuff Size: Large)   Pulse 81   Temp 97.6 F (36.4 C) (Temporal)   Ht 5\' 1"  (1.549 m)   Wt 209 lb 3.2 oz (94.9 kg)   SpO2 97%   BMI 39.53 kg/m  Wt Readings from Last 3 Encounters:  10/20/22 209 lb 3.2 oz (94.9 kg)  01/02/22 207 lb (93.9 kg)  11/25/21 208 lb (94.3 kg)      Dulce Sellar, NP

## 2022-10-23 ENCOUNTER — Encounter: Payer: Self-pay | Admitting: Family

## 2022-10-23 ENCOUNTER — Telehealth: Payer: Self-pay | Admitting: Family

## 2022-10-23 DIAGNOSIS — R070 Pain in throat: Secondary | ICD-10-CM

## 2022-10-23 MED ORDER — PREDNISONE 20 MG PO TABS: ORAL_TABLET | ORAL | 0 refills | Status: DC

## 2022-10-23 NOTE — Telephone Encounter (Signed)
Patient states: - Started amoxicillin as prescribed by PCP for strept on 4/8 following OV; Tuesday was better  - Wednesday her throat pain increased and felt like knot was in throat - States hard to swallow   Please advise further action.

## 2022-10-23 NOTE — Telephone Encounter (Signed)
Did she start the Amoxicillin on Monday, should be kicking in by now. She can take up 800mg  of Ibuprofen tid prn for pain - eat something first.  Can try chloraseptic throat spray OTC If tried all above can send steroid for pain, let me know, thx

## 2022-10-24 ENCOUNTER — Ambulatory Visit: Payer: No Typology Code available for payment source | Admitting: Family

## 2022-11-28 ENCOUNTER — Encounter: Payer: Self-pay | Admitting: Neurology

## 2022-11-28 ENCOUNTER — Telehealth: Payer: Self-pay

## 2022-11-28 DIAGNOSIS — G40B09 Juvenile myoclonic epilepsy, not intractable, without status epilepticus: Secondary | ICD-10-CM

## 2022-11-28 MED ORDER — TOPIRAMATE 50 MG PO TABS: ORAL_TABLET | ORAL | 3 refills | Status: DC

## 2022-11-28 NOTE — Telephone Encounter (Signed)
Pt called an informed that Since no triggers identified, Dr Karel Jarvis would increase the Topiramate 50mg : Take 1 tablet in AM, 2 tablets in PM. We had previously discussed doing a 2-day EEG if symptoms continue, does she want to see how she does with the increase in dose first or go ahead and schedule the 48-hour EEG? pt would like to dot he EEG order placed in epic

## 2022-11-28 NOTE — Telephone Encounter (Signed)
Pt c/o: seizure Missed medications?  No. Sleep deprived?  No. Alcohol intake?  No. Increased stress? No. Any change in medication color or shape? No. Notice any trigger? No happened sudden Back to their usual baseline self?  Yes.  . If no, advise go to ER Current medications prescribed by Dr. Karel Jarvis:   levETIRAcetam (KEPPRA XR) 750 MG 24 hr tablet TAKE 2 TABLETS BY MOUTH AT NIGHT  topiramate (TOPAMAX) 50 MG tablet Take 1 tablet twice a day  NAYZILAM 5 MG/0.1ML SOLN Place 5 mg into the nose as needed. For seizures lasting longer than 5 minutes,   Had a lingering headache yesterday this morning was fine until it hit her again she went to lunch a little early and her husband had to pick her up early

## 2022-11-28 NOTE — Telephone Encounter (Signed)
Since no triggers identified, I would increase the Topiramate 50mg : Take 1 tablet in AM, 2 tablets in PM. We had previously discussed doing a 2-day EEG if symptoms continue, does she want to see how she does with the increase in dose first or go ahead and schedule the 48-hour EEG? Pls send updated Rx to pharmacy. Thanks

## 2022-11-30 ENCOUNTER — Other Ambulatory Visit: Payer: Self-pay

## 2022-11-30 ENCOUNTER — Encounter (HOSPITAL_BASED_OUTPATIENT_CLINIC_OR_DEPARTMENT_OTHER): Payer: Self-pay

## 2022-11-30 ENCOUNTER — Emergency Department (HOSPITAL_BASED_OUTPATIENT_CLINIC_OR_DEPARTMENT_OTHER)
Admission: EM | Admit: 2022-11-30 | Discharge: 2022-11-30 | Disposition: A | Discharge status: Home / Self Care | Payer: No Typology Code available for payment source | Attending: Emergency Medicine | Admitting: Emergency Medicine

## 2022-11-30 DIAGNOSIS — R490 Dysphonia: Secondary | ICD-10-CM | POA: Diagnosis not present

## 2022-11-30 DIAGNOSIS — Z1152 Encounter for screening for COVID-19: Secondary | ICD-10-CM | POA: Insufficient documentation

## 2022-11-30 DIAGNOSIS — R42 Dizziness and giddiness: Secondary | ICD-10-CM | POA: Insufficient documentation

## 2022-11-30 LAB — SARS CORONAVIRUS 2 BY RT PCR: SARS Coronavirus 2 by RT PCR: NEGATIVE

## 2022-11-30 LAB — COMPREHENSIVE METABOLIC PANEL
ALT: 43 U/L (ref 0–44)
AST: 21 U/L (ref 15–41)
Albumin: 4.6 g/dL (ref 3.5–5.0)
Alkaline Phosphatase: 43 U/L (ref 38–126)
Anion gap: 9 (ref 5–15)
BUN: 11 mg/dL (ref 6–20)
CO2: 24 mmol/L (ref 22–32)
Calcium: 9 mg/dL (ref 8.9–10.3)
Chloride: 103 mmol/L (ref 98–111)
Creatinine, Ser: 0.82 mg/dL (ref 0.44–1.00)
GFR, Estimated: >60 mL/min (ref >60)
Glucose, Bld: 82 mg/dL (ref 70–99)
Potassium: 3.5 mmol/L (ref 3.5–5.1)
Sodium: 136 mmol/L (ref 135–145)
Total Bilirubin: 0.3 mg/dL (ref 0.3–1.2)
Total Protein: 7.1 g/dL (ref 6.5–8.1)

## 2022-11-30 LAB — HCG, SERUM, QUALITATIVE: Preg, Serum: NEGATIVE

## 2022-11-30 LAB — CBC
HCT: 41.5 % (ref 36.0–46.0)
Hemoglobin: 13.5 g/dL (ref 12.0–15.0)
MCH: 27.7 pg (ref 26.0–34.0)
MCHC: 32.5 g/dL (ref 30.0–36.0)
MCV: 85.2 fL (ref 80.0–100.0)
Platelets: 233 10*3/uL (ref 150–400)
RBC: 4.87 MIL/uL (ref 3.87–5.11)
RDW: 13.2 % (ref 11.5–15.5)
WBC: 9 10*3/uL (ref 4.0–10.5)
nRBC: 0 % (ref 0.0–0.2)

## 2022-11-30 LAB — CBG MONITORING, ED: Glucose-Capillary: 85 mg/dL (ref 70–99)

## 2022-11-30 MED ORDER — DEXAMETHASONE SODIUM PHOSPHATE 10 MG/ML IJ SOLN
6.0000 mg | Freq: Once | INTRAMUSCULAR | Status: AC
  Administered 2022-11-30: 6 mg via INTRAVENOUS
  Filled 2022-11-30: qty 1

## 2022-11-30 MED ORDER — KETOROLAC TROMETHAMINE 15 MG/ML IJ SOLN
15.0000 mg | Freq: Once | INTRAMUSCULAR | Status: AC
  Administered 2022-11-30: 15 mg via INTRAVENOUS
  Filled 2022-11-30: qty 1

## 2022-11-30 MED ORDER — LEVETIRACETAM ER 500 MG PO TB24: 500.0000 mg | ORAL_TABLET | Freq: Every day | ORAL | 0 refills | Status: DC

## 2022-11-30 MED ORDER — SODIUM CHLORIDE 0.9 % IV BOLUS
1000.0000 mL | Freq: Once | INTRAVENOUS | Status: AC
  Administered 2022-11-30: 1000 mL via INTRAVENOUS

## 2022-11-30 MED ORDER — PROCHLORPERAZINE EDISYLATE 10 MG/2ML IJ SOLN
10.0000 mg | Freq: Once | INTRAMUSCULAR | Status: AC
  Administered 2022-11-30: 10 mg via INTRAVENOUS
  Filled 2022-11-30: qty 2

## 2022-11-30 NOTE — ED Provider Notes (Signed)
[name] Provider Note   CSN: [id number] Arrival date & time: 11/30/22  1342     History  Chief Complaint  Patient presents with   Dizziness    Alexis Mills is a 27 y.o. female with a history of epilepsy and headaches presenting to ED with lightheadedness.  Patient reports symptoms onset about 3 days ago.  She has had spells nearly every day where she feels lightheaded and mildly vertiginous, feels that she cannot stand and has to hold onto things.  She said this feels similar to her post seizure symptoms in the past, although she has not had any actual seizures the past 3 days.  She did have an episode of "tremors" on Friday.  She reports that she woke up today and lost her voice.  She denies coughing, congestion, diarrhea.  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  She also reports that she has had a mild persistent headache for the past several days.  She called her neurologist yesterday who advised that she increase her Topamax that she takes for migraines.  HPI     Home Medications Prior to Admission medications   Medication Sig Start Date End Date Taking? Authorizing Provider  levETIRAcetam (KEPPRA XR) 500 MG 24 hr tablet Take 1 tablet (500 mg total) by mouth daily for 30 doses. 11/30/22 12/30/22 Yes Marguerette Sheller, Kermit Balo, MD  famotidine (PEPCID) 20 MG tablet Take 1 tablet (20 mg total) by mouth 2 (two) times daily. For allergic reaction. 11/25/21   Dulce Sellar, NP  fexofenadine-pseudoephedrine (ALLEGRA-D 24) 180-240 MG per 24 hr tablet Take 1 tablet by mouth daily.    [provider]  fluticasone (FLONASE) 50 MCG/ACT nasal spray Place 2 sprays into the nose 2 (two) times daily.    [provider]  hydrOXYzine (ATARAX) 10 MG tablet Take 1 tablet (10 mg total) by mouth 3 (three) times daily as needed for itching. 11/25/21   Dulce Sellar, NP  levETIRAcetam (KEPPRA XR) 750 MG 24 hr tablet TAKE 2 TABLETS BY  MOUTH AT  NIGHT 10/13/22   Antony Madura, MD  meclizine (ANTIVERT) 25 MG tablet Take 1 tablet (25 mg total) by mouth 3 (three) times daily as needed for dizziness. 01/02/22   Dartha Lodge, PA-C  Multiple Vitamins-Minerals (MULTIVITAMIN WITH MINERALS) tablet Take 1 tablet by mouth daily.    [provider]  NAYZILAM 5 MG/0.1ML SOLN Place 5 mg into the nose as needed. For seizures lasting longer than 5 minutes 09/14/18   Keturah Shavers, MD  ondansetron [facility] ODT) 4 MG disintegrating tablet Take 1 tablet (4 mg total) by mouth every 8 (eight) hours as needed for nausea or vomiting. 09/17/20   Caccavale, Sophia, PA-C  predniSONE (DELTASONE) 20 MG tablet Take 2 pills in the morning with breakfast for 3 days, then 1 pill for 2 days 10/23/22   Dulce Sellar, NP  topiramate (TOPAMAX) 50 MG tablet Take 1 tablet in AM, 2 tablets in PM. 11/28/22   Van Clines, MD  trimethoprim-polymyxin b (POLYTRIM) ophthalmic solution Place 1 drop into the left eye every 4 (four) hours. X 5 days 07/18/22   Margaretann Loveless, PA-C      Allergies    Other    Review of Systems   Review of Systems  Physical Exam Updated Vital Signs BP 134/89   Pulse 64   Temp 97.8 F (36.6 C)   Resp 18   Ht 5\' 1"  (1.549  m)   Wt 94.8 kg   SpO2 100%   BMI 39.49 kg/m  Physical Exam Constitutional:      General: She is not in acute distress.    Comments: Voice is hoarse  HENT:     Head: Normocephalic and atraumatic.  Eyes:     Conjunctiva/sclera: Conjunctivae normal.     Pupils: Pupils are equal, round, and reactive to light.  Cardiovascular:     Rate and Rhythm: Normal rate and regular rhythm.  Pulmonary:     Effort: Pulmonary effort is normal. No respiratory distress.  Abdominal:     General: There is no distension.     Tenderness: There is no abdominal tenderness.  Skin:    General: Skin is warm and dry.  Neurological:     General: No focal deficit present.     Mental Status: She is alert and oriented  to person, place, and time. Mental status is at baseline.     Cranial Nerves: No cranial nerve deficit.     Sensory: No sensory deficit.     Motor: No weakness.  Psychiatric:        Mood and Affect: Mood normal.        Behavior: Behavior normal.     ED Results / Procedures / Treatments   Labs (all labs ordered are listed, but only abnormal results are displayed) Labs Reviewed  SARS CORONAVIRUS 2 BY RT PCR  COMPREHENSIVE METABOLIC PANEL  CBC  HCG, SERUM, QUALITATIVE  LEVETIRACETAM LEVEL  CBG MONITORING, ED    EKG EKG Interpretation  Date/Time:  Sunday Nov 30 2022 14:19:32 EDT Ventricular Rate:  73 PR Interval:  154 QRS Duration: 86 QT Interval:  400 QTC Calculation: 440 R Axis:   46 Text Interpretation: Normal sinus rhythm Normal ECG When compared with ECG of 02-Jan-2022 11:43, No significant change was found Confirmed by Alvester Chou [phone]) on 11/30/2022 3:43:25 PM  Radiology No results found.  Procedures Procedures    Medications Ordered in ED Medications  sodium chloride 0.9 % bolus 1,000 mL (0 mLs Intravenous Stopped 11/30/22 1730)  ketorolac (TORADOL) 15 MG/ML injection 15 mg (15 mg Intravenous Given 11/30/22 1625)  prochlorperazine (COMPAZINE) injection 10 mg (10 mg Intravenous Given 11/30/22 1624)  dexamethasone (DECADRON) injection 6 mg (6 mg Intravenous Given 11/30/22 1626)    ED Course/ Medical Decision Making/ A&P Clinical Course as of 11/30/22 2004  Sun Nov 30, 2022  1831 Patient is feeling significantly better after her migraine medications.  Her headache is improved, she is no longer lightheaded.  I do suspect this medically complex migraine.  However I discussed the case with Dr Wilford Corner from neurology earlier, who advised considering increasing the patient's Keppra dosing with an additional 500 mg a day.  The patient reports that she would prefer not to take this in the daytime as it makes her extremely drowsy.  She can add it to her evening dose, to  increase her from 1500 mg to 2000 mg.  I think this is a reasonable temporary plan until she is able to follow-up with her neurologist for potential outpatient EEG.  The patient is in agreement.  Okay for discharge [MT]    Clinical Course User Index [MT] Alferd Obryant, Kermit Balo, MD                             Medical Decision Making Amount and/or Complexity of Data Reviewed Labs: ordered.  Risk Prescription drug  management.   This patient presents to the ED with concern for episodic lightheadedness. This involves an extensive number of treatment options, and is a complaint that carries with it a high risk of complications and morbidity.  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  The pertinent results include: No emergent findings  The patient was maintained on a cardiac monitor.  I personally viewed and interpreted the cardiac monitored which showed an underlying rhythm of: NSR  Per my interpretation the patient's ECG shows NSR  I ordered medication including IV migraine medications  I have reviewed the patients home medicines and have made adjustments as needed  Test Considered: low suspicion for acute PE clinically, or CVA.  No immediate risk factors for either condition.  No indication for CT angiogram or neuroimaging at this time.  Doubt meningitis - no indication for LP at this time.   After the interventions noted above, I reevaluated the patient and found that they have: improved   Dispostion:  After consideration of the diagnostic results and the patients response to treatment, I feel that the patent would benefit from close outpatient  follow up         Final Clinical Impression(s) / ED Diagnoses Final diagnoses:  Lightheadedness  Hoarseness    Rx / DC Orders ED Discharge Orders          Ordered    levETIRAcetam (KEPPRA XR) 500 MG 24 hr tablet  Daily       Note to Pharmacy: This does is in addition to current 1500 mg keppra evening dose   11/30/22 1844              Terald Sleeper, MD 11/30/22 2004

## 2022-11-30 NOTE — Discharge Instructions (Signed)
Please follow-up with your neurologist for this visit to the ER.  It is possible that you are experiencing complicated migraine or a viral syndrome, both of which should likely improve the medications given in the ED, as well as the next 2 to 3 days time.  However you should discuss this with your neurologist.  There may be some complication related to your seizure history.  You may need further testing as an outpatient.  If you have a seizure, or worsening lightheadedness, or any other emergency medical concerns, please come back to the emergency department.  Our neurologist today recommended that you increase your Keppra dosing from 1500 mg at night to 2000 mg at night.  Therefore I prescribed an additional 500 mg tablet which you can add to your current medications at bedtime.

## 2022-11-30 NOTE — ED Triage Notes (Signed)
Patient here POV from Home.  Endorses Dizziness and Lightheadedness that began and subsided Thursday. Began again Friday. Also noted Moderate/Severe Tremors Friday as well. Tremors have since subsided and endorses continued Dizziness. Some Nausea. No Vomiting.   History of Seizures. Takes Keppra (1500 mg) Daily.   NAD Noted during Triage. A&Ox4. GCS 15. Ambulatory.

## 2022-12-01 LAB — LEVETIRACETAM LEVEL: Levetiracetam Lvl: 11.1 ug/mL (ref 10.0–40.0)

## 2022-12-03 ENCOUNTER — Other Ambulatory Visit: Payer: Self-pay

## 2022-12-03 DIAGNOSIS — G40B09 Juvenile myoclonic epilepsy, not intractable, without status epilepticus: Secondary | ICD-10-CM

## 2022-12-05 ENCOUNTER — Telehealth: Payer: Self-pay | Admitting: *Deleted

## 2022-12-17 ENCOUNTER — Encounter: Payer: Self-pay | Admitting: Neurology

## 2022-12-22 ENCOUNTER — Telehealth: Payer: Self-pay

## 2022-12-22 ENCOUNTER — Ambulatory Visit: Payer: No Typology Code available for payment source | Admitting: Neurology

## 2022-12-22 DIAGNOSIS — G40B09 Juvenile myoclonic epilepsy, not intractable, without status epilepticus: Secondary | ICD-10-CM

## 2022-12-22 NOTE — Progress Notes (Signed)
Ambulatory EEG hooked up and running. Light flashing. Push button tested. Camera and event log explained. Batteries explained. Patient understood.   

## 2022-12-22 NOTE — Telephone Encounter (Signed)
Work note printed for pt 

## 2022-12-24 ENCOUNTER — Telehealth: Payer: Self-pay

## 2022-12-24 NOTE — Telephone Encounter (Signed)
Letter re printed for pt

## 2022-12-24 NOTE — Progress Notes (Signed)
AMB EEG discontinued. No skin breakdown at unhook.   

## 2023-01-02 ENCOUNTER — Telehealth: Payer: Self-pay | Admitting: Neurology

## 2023-01-02 NOTE — Telephone Encounter (Signed)
Patient states that she has has another episode, nausea, extreamly bad headaches, dizziness, seeing black spots, patient states she also had to call out of work today.

## 2023-01-05 NOTE — Telephone Encounter (Signed)
Pt called no answer left a voice mail to call the office back  °

## 2023-01-05 NOTE — Telephone Encounter (Signed)
Pls let her know that the prolonged EEG was normal, however it does not sound like she had an episode when she had the EEG on. I wonder if the episodes she is having with headache, nausea, dizziness, seeing black spots are more migraine-related rather than seizure, in light of normal EEG. Pls confirm she is on Topiramate 50mg : 1 tab in AM, 2 tabs in PM. If so, I would increase Topiramate to 100mg : Take 1 tablet BID. It is a seizure medication that helps with migraines. If she agrees, pls send in Rx for 100mg  tablet: 1 tab BID. Thanks

## 2023-01-06 NOTE — Telephone Encounter (Signed)
Paitent is returning a call to someone from Friday 01/05/23. She has been having episodes but declined triage for tonight. Would like a return call

## 2023-01-07 MED ORDER — TOPIRAMATE 100 MG PO TABS: ORAL_TABLET | ORAL | 1 refills | Status: DC

## 2023-01-07 NOTE — Telephone Encounter (Signed)
Pt called no answer left a voice mail to call the office back  °

## 2023-01-07 NOTE — Telephone Encounter (Signed)
Pt called an informed that prolonged EEG was normal, however it does not sound like she had an episode when she had the EEG on. Dr Karel Jarvis wonders if the episodes she is having with headache, nausea, dizziness, seeing black spots are more migraine-related rather than seizure, in light of normal EEG. Pls confirm she is on Topiramate 50mg : 1 tab in AM, 2 tabs in PM. If so, Dr Karel Jarvis would increase Topiramate to 100mg : Take 1 tablet BID. It is a seizure medication that helps with migraines. If she agrees, pls send in Rx for 100mg  tablet: 1 tab BID.

## 2023-01-07 NOTE — Addendum Note (Signed)
Addended by: Dimas Chyle on: 01/07/2023 02:47 PM   Modules accepted: Orders

## 2023-01-13 NOTE — Procedures (Signed)
ELECTROENCEPHALOGRAM REPORT  Dates of Recording: 12/22/2022 9:16AM to 12/24/2022 10:54AM  Patient's Name: Alexis Mills MRN: [id number] Date of Birth: Nov 29, 1995  Referring Provider: Dr. Patrcia Dolly  Procedure: 50:44-hour ambulatory video EEG  History: This is a 27 year old woman with a history of seizures with recurrent episodes of lightheadedness and tremors. EEG for classification.  Medications: Topiramate, Keppra  Technical Summary: This is a 50:44-hour multichannel digital video EEG recording measured by the international 10-20 system with electrodes applied with paste and impedances below 5000 ohms performed as portable with EKG monitoring.  The digital EEG was referentially recorded, reformatted, and digitally filtered in a variety of bipolar and referential montages for optimal display.    DESCRIPTION OF RECORDING: During maximal wakefulness, the background activity consisted of a symmetric 11 Hz posterior dominant rhythm which was reactive to eye opening.  There were no epileptiform discharges or focal slowing seen in wakefulness.  During the recording, the patient progresses through wakefulness, drowsiness, and Stage 2 sleep.  Again, there were no epileptiform discharges seen.  Events: There were 10 push button events that were accidental on review of video available. Electrographically, there were no EEG or EKG changes seen.   There were no electrographic seizures seen.  EKG lead was unremarkable.  IMPRESSION: This 50-hour ambulatory video EEG study is normal.     CLINICAL CORRELATION: A normal EEG does not exclude a clinical diagnosis of epilepsy. Typical events were not captured.  If further clinical questions remain, inpatient video EEG monitoring may be helpful.   Patrcia Dolly, M.D.

## 2023-01-16 ENCOUNTER — Encounter: Payer: Self-pay | Admitting: Family

## 2023-01-16 ENCOUNTER — Ambulatory Visit: Payer: No Typology Code available for payment source | Admitting: Family

## 2023-01-16 VITALS — BP 121/77 | HR 88 | Temp 97.8°F | Ht 61.0 in | Wt 209.0 lb

## 2023-01-16 DIAGNOSIS — J020 Streptococcal pharyngitis: Secondary | ICD-10-CM

## 2023-01-16 LAB — POCT RAPID STREP A (OFFICE): Rapid Strep A Screen: POSITIVE — AB

## 2023-01-16 MED ORDER — PENICILLIN V POTASSIUM 500 MG PO TABS
500.0000 mg | ORAL_TABLET | Freq: Two times a day (BID) | ORAL | 0 refills | Status: DC
Start: 2023-01-16 — End: 2023-04-27

## 2023-01-16 NOTE — Progress Notes (Signed)
Patient ID: Alexis Mills, female    DOB: [birth year], 27 y.o.   MRN: [id number]  Chief Complaint  Patient presents with   Sore Throat    Pt c/o sore throat and nasal congestion, Present since yesterday morning. Has tried ibuprofen, tylenol and mucinex which did not help.     HPI:      Sore throat:    Pt c/o sore throat and nasal congestion, swollen neck, difficulty swallowing. Denies fever or cough, but has felt warm & chills. Present since yesterday morning. Has tried ibuprofen, tylenol and mucinex which did not help. Pt had positive strep in March and one time last year.   Assessment & Plan:  1. Strep throat - pt reports it took 2 rounds of AMOX last time with steroids for her to feel better. No allergies, will send PCN V this time, advised to let me know if any nausea, ok to take a probiotic or eat yogurt daily. Advised pt to take  Ibuprofen 600mg  tid prn for sore throat pain, swelling, and fever. Gargle with warm salt water several tid. OK to use OTC Chloraseptic spray and/or throat lozenges prn. Drink plenty of water.   - POCT rapid strep A - penicillin v potassium (VEETID) 500 MG tablet; Take 1 tablet (500 mg total) by mouth 2 (two) times daily after a meal.  Dispense: 20 tablet; Refill: 0   Subjective:    Outpatient Medications Prior to Visit  Medication Sig Dispense Refill   famotidine (PEPCID) 20 MG tablet Take 1 tablet (20 mg total) by mouth 2 (two) times daily. For allergic reaction. 14 tablet 0   fexofenadine-pseudoephedrine (ALLEGRA-D 24) 180-240 MG per 24 hr tablet Take 1 tablet by mouth daily.     fluticasone (FLONASE) 50 MCG/ACT nasal spray Place 2 sprays into the nose 2 (two) times daily.     hydrOXYzine (ATARAX) 10 MG tablet Take 1 tablet (10 mg total) by mouth 3 (three) times daily as needed for itching. 30 tablet 0   levETIRAcetam (KEPPRA XR) 750 MG 24 hr tablet TAKE 2 TABLETS BY MOUTH AT  NIGHT 180 tablet 3   meclizine (ANTIVERT) 25 MG tablet Take 1 tablet (25  mg total) by mouth 3 (three) times daily as needed for dizziness. 30 tablet 0   Multiple Vitamins-Minerals (MULTIVITAMIN WITH MINERALS) tablet Take 1 tablet by mouth daily.     NAYZILAM 5 MG/0.1ML SOLN Place 5 mg into the nose as needed. For seizures lasting longer than 5 minutes 2 each 1   ondansetron (ZOFRAN ODT) 4 MG disintegrating tablet Take 1 tablet (4 mg total) by mouth every 8 (eight) hours as needed for nausea or vomiting. 20 tablet 0   topiramate (TOPAMAX) 100 MG tablet Take 1 tablet BID 180 tablet 1   trimethoprim-polymyxin b (POLYTRIM) ophthalmic solution Place 1 drop into the left eye every 4 (four) hours. X 5 days 10 mL 0   levETIRAcetam (KEPPRA XR) 500 MG 24 hr tablet Take 1 tablet (500 mg total) by mouth daily for 30 doses. 30 tablet 0   predniSONE (DELTASONE) 20 MG tablet Take 2 pills in the morning with breakfast for 3 days, then 1 pill for 2 days (Patient not taking: Reported on 01/16/2023) 8 tablet 0   No facility-administered medications prior to visit.   Past Medical History:  Diagnosis Date   ADD (attention deficit disorder)    Seizures (HCC)    Past Surgical History:  Procedure Laterality Date   COLPOSCOPY  09/06/2019   NO PAST SURGERIES     Allergies  Allergen Reactions   Other     Seasonal Allergies      Objective:    Physical Exam Vitals and nursing note reviewed.  Constitutional:      Appearance: Normal appearance. She is ill-appearing.     Interventions: Face mask in place.  HENT:     Right Ear: Tympanic membrane and ear canal normal.     Left Ear: Tympanic membrane and ear canal normal.     Nose:     Right Sinus: Frontal sinus tenderness present.     Left Sinus: Frontal sinus tenderness present.     Mouth/Throat:     Mouth: Mucous membranes are moist.     Pharynx: Pharyngeal swelling and posterior oropharyngeal erythema present. No oropharyngeal exudate or uvula swelling.     Tonsils: No tonsillar exudate or tonsillar abscesses.  Cardiovascular:      Rate and Rhythm: Normal rate and regular rhythm.  Pulmonary:     Effort: Pulmonary effort is normal.     Breath sounds: Normal breath sounds.  Musculoskeletal:        General: Normal range of motion.  Lymphadenopathy:     Head:     Right side of head: No tonsillar, preauricular or posterior auricular adenopathy.     Left side of head: No tonsillar, preauricular or posterior auricular adenopathy.     Cervical: Cervical adenopathy present.     Right cervical: Superficial cervical adenopathy and deep cervical adenopathy present.     Left cervical: Superficial cervical adenopathy and deep cervical adenopathy present.  Skin:    General: Skin is warm and dry.  Neurological:     Mental Status: She is alert.  Psychiatric:        Mood and Affect: Mood normal.        Behavior: Behavior normal.    BP 121/77 (BP Location: Left Arm, Patient Position: Sitting, Cuff Size: Large)   Pulse 88   Temp 97.8 F (36.6 C) (Temporal)   Ht 5\' 1"  (1.549 m)   Wt 209 lb (94.8 kg)   SpO2 97%   BMI 39.49 kg/m  Wt Readings from Last 3 Encounters:  01/16/23 209 lb (94.8 kg)  11/30/22 209 lb (94.8 kg)  10/20/22 209 lb 3.2 oz (94.9 kg)       Dulce Sellar, NP

## 2023-04-27 ENCOUNTER — Ambulatory Visit: Payer: No Typology Code available for payment source | Admitting: Neurology

## 2023-04-27 ENCOUNTER — Encounter: Payer: Self-pay | Admitting: Neurology

## 2023-04-27 VITALS — BP 126/86 | HR 76 | Ht 61.0 in | Wt 209.4 lb

## 2023-04-27 DIAGNOSIS — G43009 Migraine without aura, not intractable, without status migrainosus: Secondary | ICD-10-CM

## 2023-04-27 DIAGNOSIS — G40B09 Juvenile myoclonic epilepsy, not intractable, without status epilepticus: Secondary | ICD-10-CM | POA: Diagnosis not present

## 2023-04-27 MED ORDER — TOPIRAMATE 100 MG PO TABS: ORAL_TABLET | ORAL | 3 refills | Status: DC

## 2023-04-27 MED ORDER — NAYZILAM 5 MG/0.1ML NA SOLN: 5.0000 mg | NASAL | 5 refills | Status: DC | PRN

## 2023-04-27 MED ORDER — LEVETIRACETAM ER 750 MG PO TB24: ORAL_TABLET | ORAL | 3 refills | Status: DC

## 2023-04-27 MED ORDER — AIMOVIG 140 MG/ML ~~LOC~~ SOAJ: 1.0000 | SUBCUTANEOUS | 11 refills | Status: DC

## 2023-04-27 MED ORDER — SUMATRIPTAN SUCCINATE 50 MG PO TABS: ORAL_TABLET | ORAL | 5 refills | Status: DC

## 2023-04-27 NOTE — Patient Instructions (Signed)
Good to see you!  Start Aimovig injection every month. We will work on getting prior authorization from your insurance  2. Continue Topiramate 100mg  twice a day and Keppra XR 750mg : 2 tablets every night  3. Take sumatriptan 50mg  as needed at onset of migraine. Do not take more than 3 a week  4. Nayzilam nasal spray rescue refills also sent, take when you are starting to feel the sensation of having a seizure  5. Follow-up in 4 months, call for any changes   Seizure Precautions: 1. If medication has been prescribed for you to prevent seizures, take it exactly as directed.  Do not stop taking the medicine without talking to your doctor first, even if you have not had a seizure in a long time.   2. Avoid activities in which a seizure would cause danger to yourself or to others.  Don't operate dangerous machinery, swim alone, or climb in high or dangerous places, such as on ladders, roofs, or girders.  Do not drive unless your doctor says you may.  3. If you have any warning that you may have a seizure, lay down in a safe place where you can't hurt yourself.    4.  No driving for 6 months from last seizure, as per [facility].   Please refer to the following link on the Epilepsy Foundation of America's website for more information: http://www.epilepsyfoundation.org/answerplace/Social/driving/drivingu.cfm   5.  Maintain good sleep hygiene. Avoid alcohol.  6.  Notify your neurology if you are planning pregnancy or if you become pregnant.  7.  Contact your doctor if you have any problems that may be related to the medicine you are taking.  8.  Call 911 and bring the patient back to the ED if:        A.  The seizure lasts longer than 5 minutes.       B.  The patient doesn't awaken shortly after the seizure  C.  The patient has new problems such as difficulty seeing, speaking or moving  D.  The patient was injured during the seizure  E.  The patient has a temperature over 102 F  (39C)  F.  The patient vomited and now is having trouble breathing

## 2023-04-27 NOTE — Progress Notes (Signed)
NEUROLOGY FOLLOW UP OFFICE NOTE  Alexis Mills [id number] Jul 18, 1995  HISTORY OF PRESENT ILLNESS: I had the pleasure of seeing Alexis Mills in follow-up in the neurology clinic on 04/27/2023.  The patient was last seen over a year ago for seizures and migraines. She is again accompanied by her husband who helps supplement the history today.  Records and images were personally reviewed where available.  Since her last visit, she contacted our office in 12/2021 about an episode where she got extremely lightheaded and dizzy with room spinning. She went to the ER 01/02/22, CBC/CMP, EKG, head CT normal. We increased Topiramate to 50mg  BID. Wake and drowsy EEG in 01/2022 was normal. In 11/2022, she reported another episdoe of lightheaded, dizziness, and tremors for 2 days. She went to the ER where she was treated with a migraine cocktail.  Topiramate increased to 50mg  in AM, 100mg  in PM. She had a 50-hour EEG in 12/2022 which was normal. She contacted our office after the EEG reporting another episode of nausea, extremely bad headaches, dizziness, seeing spots, having to call out of work. Topiramate increased to 100mg  BID for migraine prophylaxis. Since then, she had one milder episode last month. She had a really bad migraine and was nauseated, she felt like she would have a seizure. It was later in the day so she went home and rested that weekend. She took a Tylenol and went to bed. She continues to have migraines 2-3 times a week with nausea and light sensitivity. No clear migraine triggers, no catamenial component. She gets 8 hours of sleep. Her husband notes she snores, no daytime drowsiness. She has not had any tremors since 11/2022. No staring/unresponsive episodes. No GTCs since 2021. She is also on Levetiracetam ER 750mg  2 tabs at bedtime (1500mg  at bedtime). No side effects on medications, she denies any vision changes, focal numbness/tingling/weakness, no falls. They are hoping for pregnancy but this  is currently on hold. 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    History on Initial Assessment 09/26/2021: This is a pleasant 27 year old right-handed woman with a history of migraines, Juvenile Myoclonic Epilepsy, presenting to establish adult epilepsy care. Records from her pediatric neurologist Dr. Devonne Doughty were reviewed. She started having recurrent generalized tonic-clonic seizures at age 27. She does not recall any prior warning to the first seizure, but since then she would have myoclonic jerks that she calls "tremors" progressing to a GTC. She was initially evaluated at [facility] and had an ambulatory and overnight inpatient EEG which were reportedly normal. She started seeing Dr. Devonne Doughty in 2014 after she had an abnormal EEG reporting a few episodes of photoparoxysmal response with sharp contoured waves during IPS mainly at 21 Hz with photoparoxysmal activity lasting 10 seconds without clinical correlate. She was started on Levetiracetam and became seizure-free for several years so medication was weaned down. In 2020, they reported 2 episodes of confusion and alteration of awareness occurring with severe headaches, no motor component. 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 LEV was weaned off until she had a seizure during her honeymoon in 2021. EEG at that time showed a few brief clusters of generalized discharges particularly during high-frequency IPS and during drowsiness/sleep. Levetiracetam ER 1500mg  qhs was restarted with no further GTCs since 2021. She denies any staring/unresponsive episodes. The myoclonic jerks had quieted down as well, until around a month  ago when she started feeling very lightheaded before going to bed. The symptoms eased up and she slept, but woke up very lightheaded the next morning and recalls having a couple of "tremors" while getting ready for work. The  lightheadedness progressively worsened that she had to leave work early and called out the next day. She recalls her head hurt and she felt so foggy, this eventually improved that afternoon but she had a lingering headache similar to how she felt after her seizures in the past. She denied feeling confused, no speech/comprehension difficulties. She was probably a little more stressed out from work that time, but denies any sleep deprivation, missed medications, or alcohol use. Prior to the recent episode, she was not having any headaches on Topiramate 50mg  qhs. She denies any olfactory/gustatory hallucinations, focal numbness/tingling/weakness. No diplopia, dysarthria/dysphagia, bowel/bladder dysfunction. 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 Memory is good. She usually gets 8 hours of sleep.  Epilepsy Risk Factors:  She was adopted, but was told her mother had substance abuse issues and as a newborn she had to be in "rehab for 6 months." There is no history of febrile convulsions, CNS infections such as meningitis/encephalitis, significant traumatic brain injury, neurosurgical procedures   PAST MEDICAL HISTORY: Past Medical History:  Diagnosis Date   ADD (attention deficit disorder)    Seizures (HCC)     MEDICATIONS: Current Outpatient Medications on File Prior to Visit  Medication Sig Dispense Refill   famotidine (PEPCID) 20 MG tablet Take 1 tablet (20 mg total) by mouth 2 (two) times daily. For allergic reaction. 14 tablet 0   fexofenadine-pseudoephedrine (ALLEGRA-D 24) 180-240 MG per 24 hr tablet Take 1 tablet by mouth daily.     fluticasone (FLONASE) 50 MCG/ACT nasal spray Place 2 sprays into the nose 2 (two) times daily.     hydrOXYzine (ATARAX) 10 MG tablet Take 1 tablet (10 mg total) by mouth 3 (three) times daily as needed for itching. 30 tablet 0   levETIRAcetam (KEPPRA XR) 500 MG 24 hr tablet Take 1 tablet (500 mg total) by mouth daily for 30 doses. 30 tablet 0    levETIRAcetam (KEPPRA XR) 750 MG 24 hr tablet TAKE 2 TABLETS BY MOUTH AT  NIGHT 180 tablet 3   meclizine (ANTIVERT) 25 MG tablet Take 1 tablet (25 mg total) by mouth 3 (three) times daily as needed for dizziness. 30 tablet 0   Multiple Vitamins-Minerals (MULTIVITAMIN WITH MINERALS) tablet Take 1 tablet by mouth daily.     NAYZILAM 5 MG/0.1ML SOLN Place 5 mg into the nose as needed. For seizures lasting longer than 5 minutes 2 each 1   ondansetron (ZOFRAN ODT) 4 MG disintegrating tablet Take 1 tablet (4 mg total) by mouth every 8 (eight) hours as needed for nausea or vomiting. 20 tablet 0   penicillin v potassium (VEETID) 500 MG tablet Take 1 tablet (500 mg total) by mouth 2 (two) times daily after a meal. 20 tablet 0   topiramate (TOPAMAX) 100 MG tablet Take 1 tablet BID 180 tablet 1   trimethoprim-polymyxin b (POLYTRIM) ophthalmic solution Place 1 drop into the left eye every 4 (four) hours. X 5 days 10 mL 0   No current facility-administered medications on file prior to visit.    ALLERGIES: Allergies  Allergen Reactions   Other     Seasonal Allergies    FAMILY HISTORY: Family History  Adopted: Yes    SOCIAL HISTORY: Social History   Socioeconomic History  Marital status: Married    Spouse name: Not on file   Number of children: Not on file   Years of education: Not on file   Highest education level: 12th grade  Occupational History   Occupation: Preschool teacher    Comment: [employer]  Tobacco Use   Smoking status: Never    Passive exposure: Never   Smokeless tobacco: Never  Vaping Use   Vaping status: Never Used  Substance and Sexual Activity   Alcohol use: Not Currently   Drug use: No   Sexual activity: Yes    Birth control/protection: Pill  Other Topics Concern   Not on file  Social History Narrative   Markeesha attends [employer]. She is doing well.   Patient was adopted at 27 year of age.   Right handed   Social Determinants  of Health   Financial Resource Strain: Low Risk  (01/16/2023)   Overall Financial Resource Strain (CARDIA)    Difficulty of Paying Living Expenses: Not hard at all  Food Insecurity: No Food Insecurity (01/16/2023)   Hunger Vital Sign    Worried About Running Out of Food in the Last Year: Never true    Ran Out of Food in the Last Year: Never true  Transportation Needs: No Transportation Needs (01/16/2023)   PRAPARE - Administrator, Civil Service (Medical): No    Lack of Transportation (Non-Medical): No  Physical Activity: Insufficiently Active (01/16/2023)   Exercise Vital Sign    Days of Exercise per Week: 2 days    Minutes of Exercise per Session: 10 min  Stress: No Stress Concern Present (01/16/2023)   [employer] of Occupational Health - Occupational Stress Questionnaire    Feeling of Stress : Not at all  Social Connections: Moderately Integrated (01/16/2023)   Social Connection and Isolation Panel [NHANES]    Frequency of Communication with Friends and Family: Three times a week    Frequency of Social Gatherings with Friends and Family: More than three times a week    Attends Religious Services: More than 4 times per year    Active Member of [employer] or Organizations: No    Attends Engineer, structural: Not on file    Marital Status: Married  Intimate Partner Violence: Unknown (10/18/2021)   Received from [facility], [facility]   HITS    Physically Hurt: Not on file    Insult or Talk Down To: Not on file    Threaten Physical Harm: Not on file    Scream or Curse: Not on file     PHYSICAL EXAM: Vitals:   04/27/23 0840  BP: 126/86  Pulse: 76  SpO2: 98%   General: No acute distress Head:  Normocephalic/atraumatic Skin/Extremities: No rash, no edema Neurological Exam: alert and awake. No aphasia or dysarthria. Fund of knowledge is appropriate.  Attention and concentration are normal.   Cranial nerves: Pupils equal, round. Extraocular movements intact  with no nystagmus. Visual fields full.  No facial asymmetry.  Motor: Bulk and tone normal, muscle strength 5/5 throughout with no pronator drift.   Finger to nose testing intact.  Gait narrow-based and steady, able to tandem walk adequately.  Romberg negative.   IMPRESSION: This is a pleasant 27 yo RH woman with a history of migraines and Juvenile Myoclonic Epilepsy. No GTCs since 2021. She has been having episodes of lightheadedness/dizziness, some with tremors, most associated with headaches. Her ambulatory 50-hour EEG was normal. Episodes may be more migraine-related,  however sometimes she feels it would lead to a seizure. She is on Levetiracetam ER 750mg  2 tabs at bedtime (1500mg  at bedtime) and Topiramate 100mg  BID. We discussed starting a CGRP inhibitor, Aimovig, for migraine prophylaxis. She was also given a prescription for sumatriptan for migraine rescue. Side effects of medications discussed. Refills sent for prn Nayzilam for seizure rescue, she may take as needed. We discussed issues in women with epilepsy, start daily folic acid. Once planning for pregnancy, we will reduce Topiramate to lowest dose possible and check prepregnancy Keppra level. She is aware of [place] driving laws to stop driving after a seizure until 6 months seizure-free. She will keep a calendar of symptoms as she starts Aimovig. Follow-up in 4 months, call for any changes.   Thank you for allowing me to participate in her care.  Please do not hesitate to call for any questions or concerns.    Patrcia Dolly, M.D.   CC: Dulce Sellar, NP

## 2023-04-29 ENCOUNTER — Ambulatory Visit (HOSPITAL_COMMUNITY)
Admission: RE | Admit: 2023-04-29 | Discharge: 2023-04-29 | Disposition: A | Discharge status: Home / Self Care | Payer: PRIVATE HEALTH INSURANCE | Source: Ambulatory Visit | Attending: Neurology | Admitting: Neurology

## 2023-04-29 ENCOUNTER — Encounter: Payer: Self-pay | Admitting: Family

## 2023-04-29 ENCOUNTER — Encounter (HOSPITAL_COMMUNITY): Payer: Self-pay

## 2023-04-29 VITALS — BP 130/89 | HR 98 | Temp 98.5°F | Resp 18

## 2023-04-29 DIAGNOSIS — J069 Acute upper respiratory infection, unspecified: Secondary | ICD-10-CM | POA: Diagnosis not present

## 2023-04-29 MED ORDER — BENZONATATE 100 MG PO CAPS: 100.0000 mg | ORAL_CAPSULE | Freq: Three times a day (TID) | ORAL | 0 refills | Status: DC

## 2023-04-29 MED ORDER — ONDANSETRON HCL 4 MG PO TABS: 4.0000 mg | ORAL_TABLET | Freq: Four times a day (QID) | ORAL | 0 refills | Status: DC

## 2023-04-29 MED ORDER — PROMETHAZINE-DM 6.25-15 MG/5ML PO SYRP: 5.0000 mL | ORAL_SOLUTION | Freq: Four times a day (QID) | ORAL | 0 refills | Status: DC | PRN

## 2023-04-29 NOTE — ED Provider Notes (Signed)
[facility]    CSN: [id number] Arrival date & time: 04/29/23  1749      History   Chief Complaint Chief Complaint  Patient presents with   Cough    Low grade fever, 99.0-99.4Coughing Tightness in chest Rattling when I breathe - Entered by patient   Nasal Congestion    HPI Alexis Mills is a 27 y.o. female.   Presenting with a cough that started yesterday. She additionally reports of temp 100.31F, she did take tylenol today. Additionally she did take Mucinex last night. No other medications today. Tyelenol did help with fever and she was able to take a nap. 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 She denies congestion. She does endorse that her chest feels full and tight. Feels like she has a weight on her chest due to her cough. Denies history of asthma, no smoking, no vaping. Uses Allegra daily for environmental allergies. She has tested positive for strep this year. Currently denies throat pain. Does endorse a slight post nasal drip.      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 Authorizing Provider  benzonatate (TESSALON) 100 MG  capsule Take 1 capsule (100 mg total) by mouth every 8 (eight) hours. 04/29/23  Yes Zaydn Gutridge, Ludger Nutting, NP  ondansetron (ZOFRAN) 4 MG tablet Take 1 tablet (4 mg total) by mouth every 6 (six) hours. 04/29/23  Yes Mando Blatz, Ludger Nutting, NP  promethazine-dextromethorphan (PROMETHAZINE-DM) 6.25-15 MG/5ML syrup Take 5 mLs by mouth 4 (four) times daily as needed for cough. 04/29/23  Yes Carlise Stofer, Ludger Nutting, NP  Erenumab-aooe (AIMOVIG) 140 MG/ML SOAJ Inject 140 mg into the skin every 30 (thirty) days. 04/27/23   Van Clines, MD  famotidine (PEPCID) 20 MG tablet Take 1 tablet (20 mg total) by mouth 2 (two) times daily. For allergic reaction. 11/25/21   Dulce Sellar, NP  fexofenadine-pseudoephedrine (ALLEGRA-D 24) 180-240 MG per 24 hr tablet Take 1 tablet by mouth daily.    [provider]  fluticasone (FLONASE) 50 MCG/ACT nasal spray Place 2 sprays into the nose 2 (two) times daily.    [provider]  hydrOXYzine (ATARAX) 10 MG tablet Take 1 tablet (10 mg total) by mouth 3 (three) times daily as needed for itching. 11/25/21   Dulce Sellar, NP  levETIRAcetam (KEPPRA XR) 750 MG 24 hr tablet TAKE 2 TABLETS BY MOUTH AT  NIGHT 04/27/23  Van Clines, MD  Multiple Vitamins-Minerals (MULTIVITAMIN WITH MINERALS) tablet Take 1 tablet by mouth daily.    [provider]  NAYZILAM 5 MG/0.1ML SOLN Place 5 mg into the nose as needed. For seizures lasting longer than 5 minutes 04/27/23   Van Clines, MD  SUMAtriptan (IMITREX) 50 MG tablet Take 1 tablet at onset of migraine. May repeat in 2 hours if headache persists or recurs. Do not take more than 3 a week 04/27/23   Van Clines, MD  topiramate (TOPAMAX) 100 MG tablet Take 1 tablet BID 04/27/23   Van Clines, MD    Family History Family History  Adopted: Yes    Social History Social History   Tobacco Use   Smoking status: Never    Passive exposure: Never   Smokeless tobacco: Never  Vaping Use   Vaping status: Never Used   Substance Use Topics   Alcohol use: Not Currently   Drug use: No     Allergies   Other   Review of Systems Review of Systems  Respiratory:  Positive for cough.      Physical Exam Triage Vital Signs ED Triage Vitals [04/29/23 1838]  Encounter Vitals Group     BP 130/89     Systolic BP Percentile      Diastolic BP Percentile      Pulse Rate 98     Resp 18     Temp 98.5 F (36.9 C)     Temp Source Oral     SpO2 97 %     Weight      Height      Head Circumference      Peak Flow      Pain Score      Pain Loc      Pain Education      Exclude from Growth Chart    No data found.  Updated Vital Signs BP 130/89 (BP Location: Left Arm)   Pulse 98   Temp 98.5 F (36.9 C) (Oral)   Resp 18   LMP 04/14/2023   SpO2 97%   Visual Acuity Right Eye Distance:   Left Eye Distance:   Bilateral Distance:    Right Eye Near:   Left Eye Near:    Bilateral Near:     Physical Exam Constitutional:      Appearance: Normal appearance.  HENT:     Head: Normocephalic.     Right Ear: Hearing and tympanic membrane normal.     Left Ear: Hearing and tympanic membrane normal.     Nose: Nose normal.     Mouth/Throat:     Mouth: Mucous membranes are moist.     Pharynx: Oropharynx is clear. Posterior oropharyngeal erythema present.  Eyes:     Extraocular Movements: Extraocular movements intact.  Cardiovascular:     Rate and Rhythm: Normal rate and regular rhythm.  Pulmonary:     Effort: Pulmonary effort is normal.  Musculoskeletal:        General: Normal range of motion.     Cervical back: Normal range of motion and neck supple.  Lymphadenopathy:     Comments: No swollen lymph nodes  Skin:    General: Skin is warm and dry.  Neurological:     Mental Status: She is alert and oriented to person, place, and time.  Psychiatric:        Attention and Perception: Attention normal.        Mood and Affect: Mood normal.  Speech: Speech normal.        Behavior: Behavior  normal.      UC Treatments / Results  Labs (all labs ordered are listed, but only abnormal results are displayed) Labs Reviewed - No data to display  EKG   Radiology No results found.  Procedures Procedures (including critical care time)  Medications Ordered in UC Medications - No data to display  Initial Impression / Assessment and Plan / UC Course  I have reviewed the triage vital signs and the nursing notes.  Pertinent labs & imaging results that were available during my care of the patient were reviewed by me and considered in my medical decision making (see chart for details). Recommend continuing tylenol. Sent in scripts for Mucinex DM, tessalon pearls, and zofran for symptom management. She plans to not return to work until she is fever free. She additionally will follow up with her PCP or [place] if her symptoms worsen, she develops a productive cough, or a worsening fever.        Final Clinical Impressions(s) / UC Diagnoses   Final diagnoses:  Viral URI with cough     Discharge Instructions      Symptoms are consistent with upper respiratory infection. Recommend continuing tylenol for low grade fever. Will prescribe tessalon pearls, zofran, and Mucinex DM as needed. Please ensure you are drinking plenty of water. Please return if fever or cough worsen. Additionally may follow up with PCP as well.      ED Prescriptions     Medication Sig Dispense Auth. Provider   benzonatate (TESSALON) 100 MG capsule Take 1 capsule (100 mg total) by mouth every 8 (eight) hours. 21 capsule Elmer Picker, NP   promethazine-dextromethorphan (PROMETHAZINE-DM) 6.25-15 MG/5ML syrup Take 5 mLs by mouth 4 (four) times daily as needed for cough. 118 mL Elmer Picker, NP   ondansetron (ZOFRAN) 4 MG tablet Take 1 tablet (4 mg total) by mouth every 6 (six) hours. 12 tablet Elmer Picker, NP      PDMP not reviewed this encounter.   Elmer Picker, NP 04/29/23 2032

## 2023-04-29 NOTE — Discharge Instructions (Addendum)
Symptoms are consistent with upper respiratory infection. Recommend continuing tylenol for low grade fever. Will prescribe tessalon pearls, zofran, and Mucinex DM as needed. Please ensure you are drinking plenty of water. Please return if fever or cough worsen. Additionally may follow up with PCP as well.

## 2023-04-29 NOTE — ED Triage Notes (Signed)
Pt reports started having cough and low grade fever yesterday. Didn't go to work today. Had two home covid tests that were both negative. Pt took Mucinex last night,

## 2023-05-01 ENCOUNTER — Other Ambulatory Visit (HOSPITAL_COMMUNITY): Payer: Self-pay

## 2023-05-01 ENCOUNTER — Telehealth: Payer: Self-pay | Admitting: Pharmacy Technician

## 2023-05-01 NOTE — Telephone Encounter (Signed)
Pharmacy Patient Advocate Encounter  Received notification from [employer] that Prior Authorization for AIMOVIG 140MG  has been APPROVED from 9.18.24 to 10.18.25. Ran test claim, Copay is $281.63. This test claim was processed through [facility]- copay amounts may vary at other pharmacies due to pharmacy/plan contracts, or as the patient moves through the different stages of their insurance plan.   PA #/Case ID/Reference #: [id number]  Pt has an unmet deductible of ~$550, resulting in a high copay. However, patient does qualify for copay card. Test billing with [facility] reduced the copay down to $5.  Here's your Aimovig Copay Card information:  GROUP: [id number] MEMBER: [id number] BIN: [id number] PCN: [id number]

## 2023-05-01 NOTE — Telephone Encounter (Signed)
Pharmacy Patient Advocate Encounter   Received notification from Fax that prior authorization for AIMOVIG 140MG  is required/requested.   Insurance verification completed.   The patient is insured through [employer] .   Per test claim: PA required; PA submitted to [employer] via CoverMyMeds Key/confirmation #/EOC [id number] Status is pending

## 2023-05-06 ENCOUNTER — Encounter: Payer: Self-pay | Admitting: Neurology

## 2023-05-07 ENCOUNTER — Telehealth: Payer: Self-pay

## 2023-05-07 ENCOUNTER — Telehealth: Payer: Self-pay | Admitting: Pharmacy Technician

## 2023-05-07 ENCOUNTER — Other Ambulatory Visit (HOSPITAL_COMMUNITY): Payer: Self-pay

## 2023-05-07 NOTE — Telephone Encounter (Signed)
Pharmacy Patient Advocate Encounter  Received notification from [employer] that Prior Authorization for [facility] 5MG  has been APPROVED from 9.24.24 to 10.24.25. Ran test claim, Copay is $20. This test claim was processed through [facility]- copay amounts may vary at other pharmacies due to pharmacy/plan contracts, or as the patient moves through the different stages of their insurance plan.   PA #/Case ID/Reference #: [id number]

## 2023-05-07 NOTE — Telephone Encounter (Signed)
Pt called an informed that PA on Nayzilam was approved

## 2023-05-07 NOTE — Telephone Encounter (Signed)
Pharmacy Patient Advocate Encounter   Received notification from Physician's Office that prior authorization for [facility] 5MG  is required/requested.   Insurance verification completed.   The patient is insured through [employer] .   Per test claim: PA required; PA submitted to [employer] via CoverMyMeds Key/confirmation #/EOC [id number] Status is pending

## 2023-05-08 MED ORDER — NAYZILAM 5 MG/0.1ML NA SOLN: 5.0000 mg | NASAL | 5 refills | Status: DC | PRN

## 2023-05-12 ENCOUNTER — Telehealth: Payer: No Typology Code available for payment source | Admitting: Physician Assistant

## 2023-05-12 DIAGNOSIS — B9689 Other specified bacterial agents as the cause of diseases classified elsewhere: Secondary | ICD-10-CM | POA: Diagnosis not present

## 2023-05-12 DIAGNOSIS — J208 Acute bronchitis due to other specified organisms: Secondary | ICD-10-CM

## 2023-05-13 MED ORDER — DOXYCYCLINE HYCLATE 100 MG PO TABS
100.0000 mg | ORAL_TABLET | Freq: Two times a day (BID) | ORAL | 0 refills | Status: DC
Start: 2023-05-13 — End: 2023-09-04

## 2023-05-13 MED ORDER — BENZONATATE 100 MG PO CAPS
100.0000 mg | ORAL_CAPSULE | Freq: Three times a day (TID) | ORAL | 0 refills | Status: DC | PRN
Start: 2023-05-13 — End: 2023-09-04

## 2023-05-13 NOTE — Progress Notes (Signed)
E-Visit for Cough   We are sorry that you are not feeling well.  Here is how we plan to help!  Based on your presentation I believe you most likely have A cough due to bacteria.  When patients have a productive cough with a change in color or increased sputum production, we are concerned about bacterial bronchitis.  If left untreated it can progress to pneumonia.  If your symptoms do not improve with your treatment plan it is important that you contact your provider.   I have prescribed Doxycycline 100 mg twice a day for 7 days     In addition you may use A prescription cough medication called Tessalon Perles 100mg . You may take 1-2 capsules every 8 hours as needed for your cough.   From your responses in the eVisit questionnaire you describe inflammation in the upper respiratory tract which is causing a significant cough.  This is commonly called Bronchitis and has four common causes:   Allergies Viral Infections Acid Reflux Bacterial Infection Allergies, viruses and acid reflux are treated by controlling symptoms or eliminating the cause. An example might be a cough caused by taking certain blood pressure medications. You stop the cough by changing the medication. Another example might be a cough caused by acid reflux. Controlling the reflux helps control the cough.  USE OF BRONCHODILATOR ("RESCUE") INHALERS: There is a risk from using your bronchodilator too frequently.  The risk is that over-reliance on a medication which only relaxes the muscles surrounding the breathing tubes can reduce the effectiveness of medications prescribed to reduce swelling and congestion of the tubes themselves.  Although you feel brief relief from the bronchodilator inhaler, your asthma may actually be worsening with the tubes becoming more swollen and filled with mucus.  This can delay other crucial treatments, such as oral steroid medications. If you need to use a bronchodilator inhaler daily, several times per  day, you should discuss this with your provider.  There are probably better treatments that could be used to keep your asthma under control.     HOME CARE Only take medications as instructed by your medical team. Complete the entire course of an antibiotic. Drink plenty of fluids and get plenty of rest. Avoid close contacts especially the very young and the elderly Cover your mouth if you cough or cough into your sleeve. Always remember to wash your hands A steam or ultrasonic humidifier can help congestion.   GET HELP RIGHT AWAY IF: You develop worsening fever. You become short of breath You cough up blood. Your symptoms persist after you have completed your treatment plan MAKE SURE YOU  Understand these instructions. Will watch your condition. Will get help right away if you are not doing well or get worse.    Thank you for choosing an e-visit.  Your e-visit answers were reviewed by a board certified advanced clinical practitioner to complete your personal care plan. Depending upon the condition, your plan could have included both over the counter or prescription medications.  Please review your pharmacy choice. Make sure the pharmacy is open so you can pick up prescription now. If there is a problem, you may contact your provider through [employer] and have the prescription routed to another pharmacy.  Your safety is important to [place]. If you have drug allergies check your prescription carefully.   For the next 24 hours you can use MyChart to ask questions about today's visit, request a non-urgent call back, or ask for a work  or school excuse. You will get an email in the next two days asking about your experience. I hope that your e-visit has been valuable and will speed your recovery.

## 2023-05-13 NOTE — Progress Notes (Signed)
I have spent 5 minutes in review of e-visit questionnaire, review and updating patient chart, medical decision making and response to patient.   Mia Milan Cody Jacklynn Dehaas, PA-C    

## 2023-05-15 IMAGING — CT CT HEAD W/O CM
4 series · 16 of 47 positions shown, 18 images · non-contrast
Comparison: CT head 02/24/2011

CLINICAL DATA: Dizziness



[Series 2: head wo · axial · 0.40mm/px · z∈[-120,-15]mm · 7 of 29 slices shown, 9 images]
[im 4/29  brain]
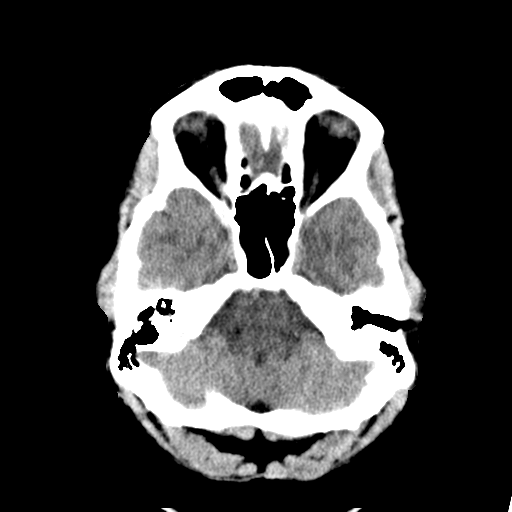
[im 4/29  bone]
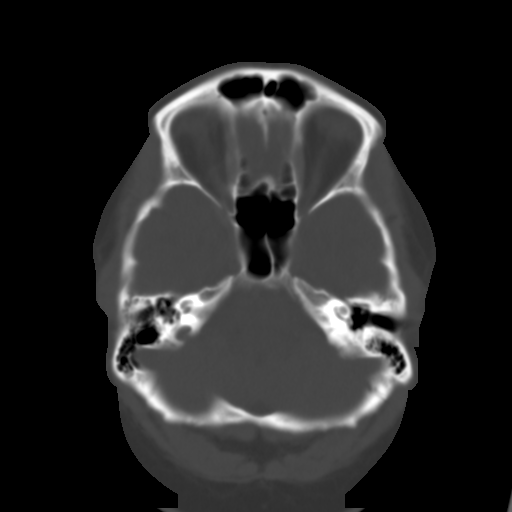
[im 8/29  brain]
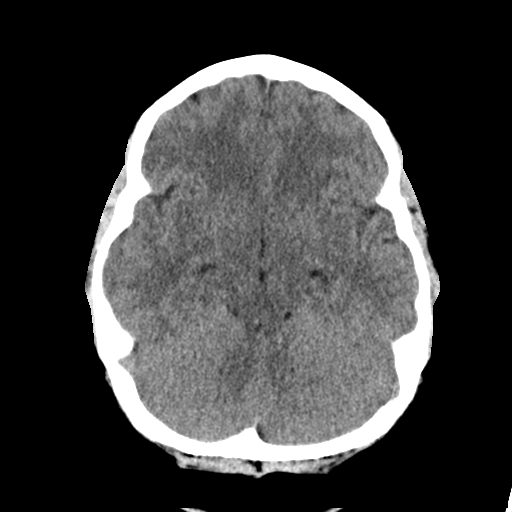
[im 11/29  brain]
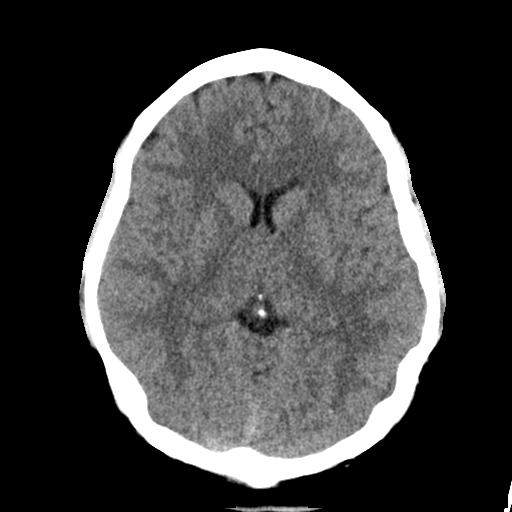
[im 15/29  brain]
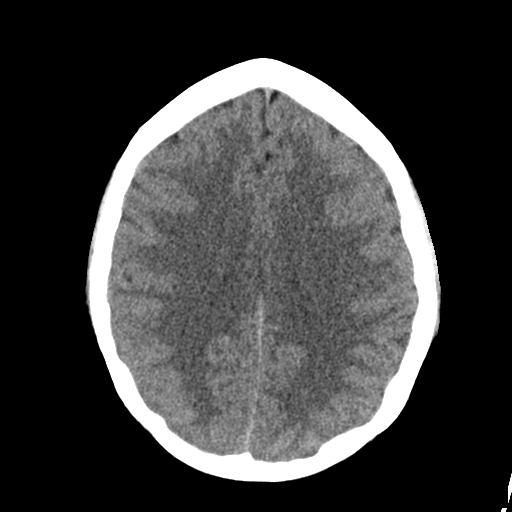
[im 18/29  brain]
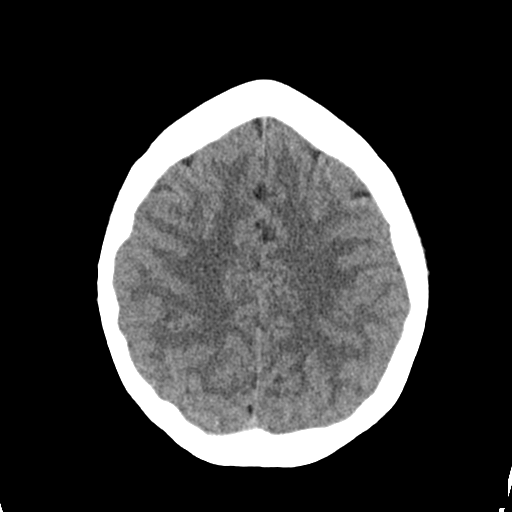
[im 18/29  bone]
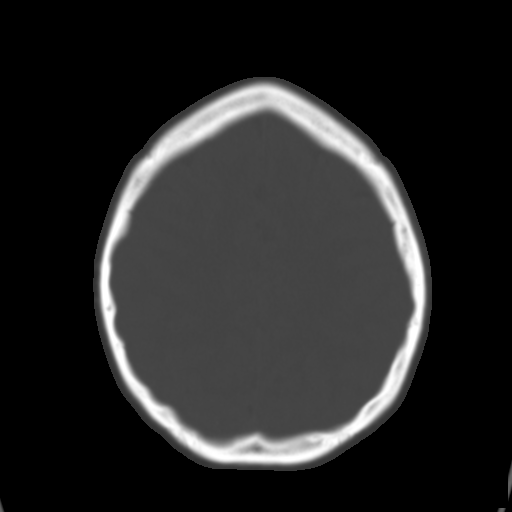
[im 22/29  brain]
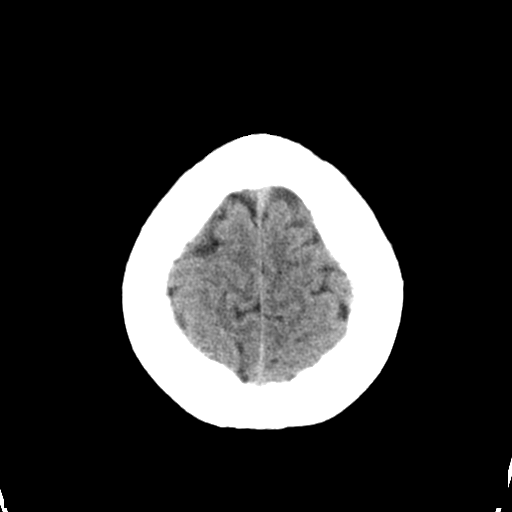
[im 25/29  brain]
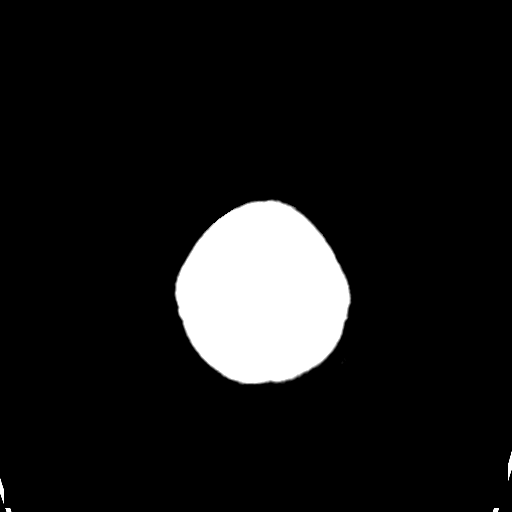

[Series 3: head bone · axial · 0.40mm/px · z∈[-121,-93]mm · 3 of 71 slices shown]
[im 8/71  bone]
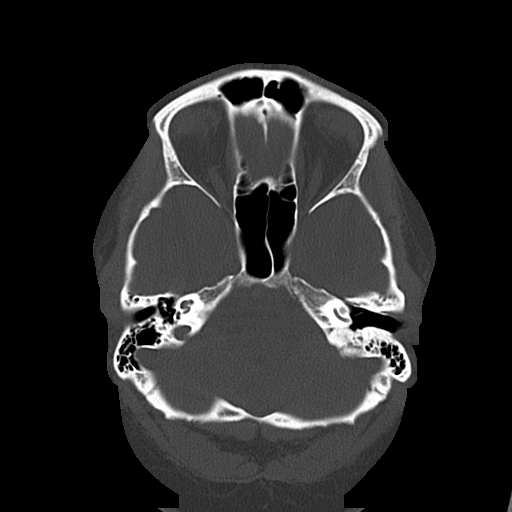
[im 15/71  bone]
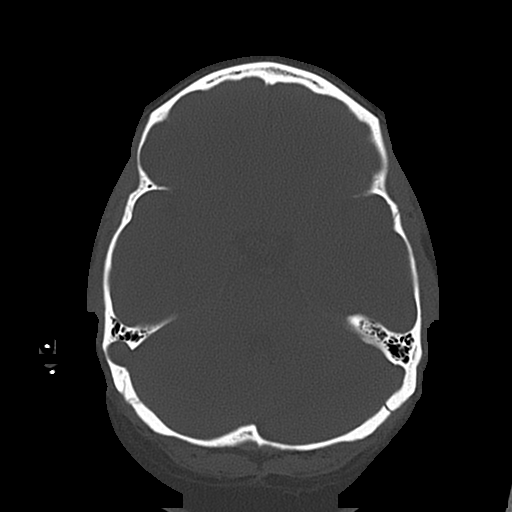
[im 22/71  bone]
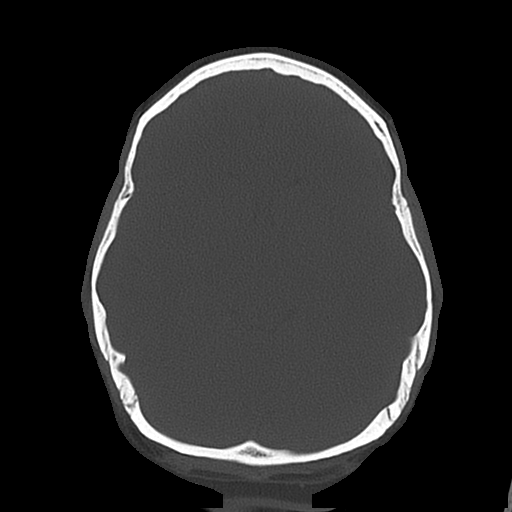

[Series 4: coronal soft · coronal · 0.29mm/px · 3 of 63 slices shown]
[im 21/63  brain]
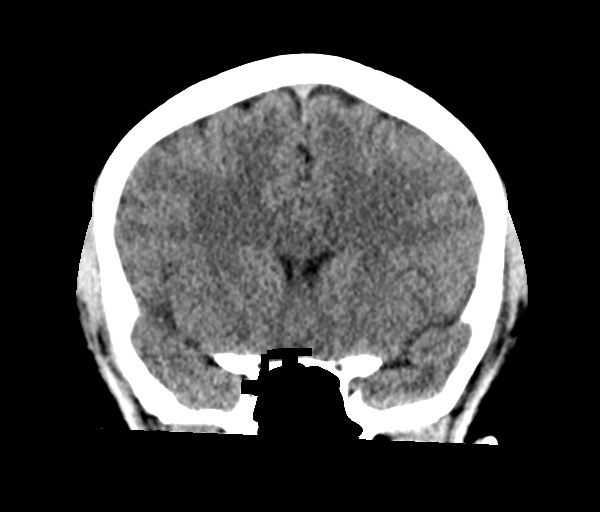
[im 28/63  brain]
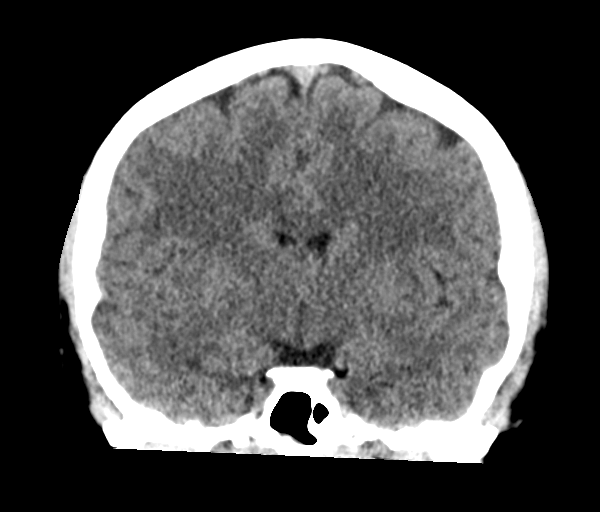
[im 35/63  brain]
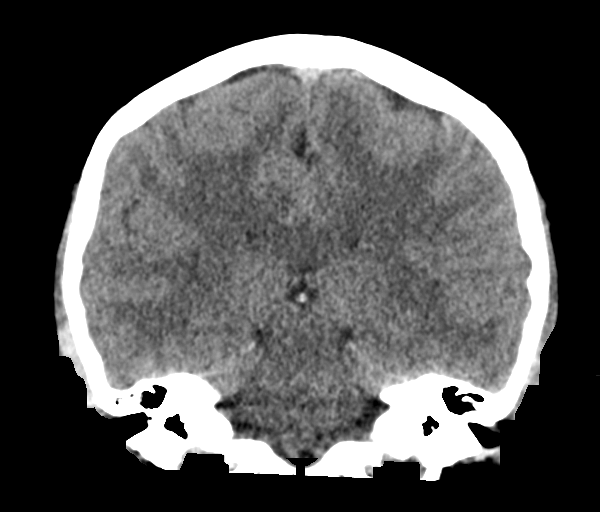

[Series 5: sagittal soft · sagittal · 0.29mm/px · 3 of 60 slices shown]
[im 20/60  brain]
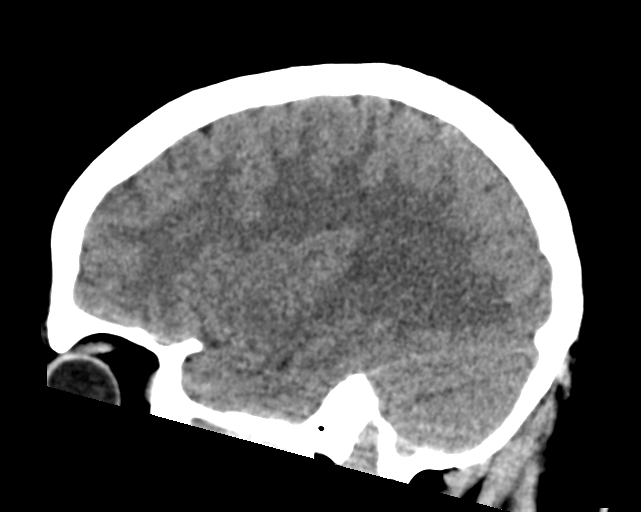
[im 30/60  brain]
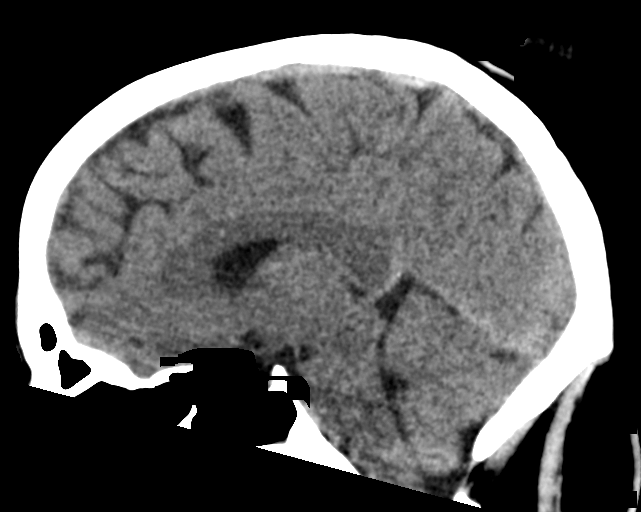
[im 40/60  brain]
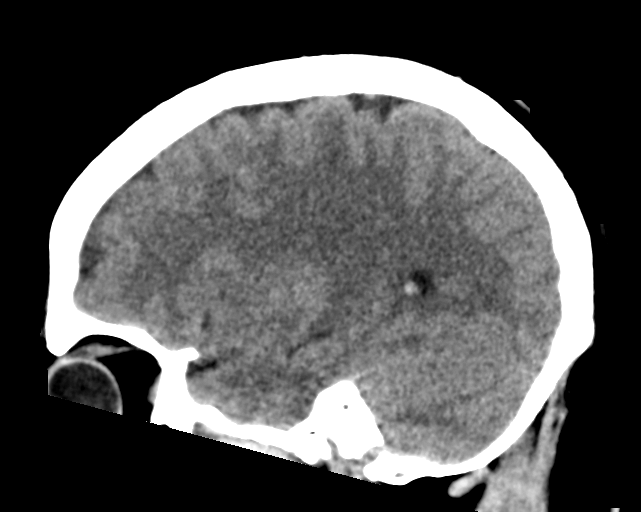

[16 of 47 positions shown; findings below may reference images not displayed]

FINDINGS: Brain: No evidence of acute infarction, hemorrhage, hydrocephalus,
extra-axial collection or mass lesion/mass effect.

Vascular: Negative for hyperdense vessel

Skull: Negative

Sinuses/Orbits: Negative

Other: None
IMPRESSION: Negative CT head

## 2023-05-29 ENCOUNTER — Encounter: Payer: Self-pay | Admitting: Neurology

## 2023-06-10 ENCOUNTER — Ambulatory Visit: Payer: PRIVATE HEALTH INSURANCE

## 2023-06-10 DIAGNOSIS — G43009 Migraine without aura, not intractable, without status migrainosus: Secondary | ICD-10-CM

## 2023-06-10 MED ORDER — ERENUMAB-AOOE 140 MG/ML ~~LOC~~ SOAJ
140.0000 mg | Freq: Once | SUBCUTANEOUS | Status: AC
Start: 2023-06-10 — End: 2023-06-10
  Administered 2023-06-10: 140 mg via SUBCUTANEOUS

## 2023-06-10 NOTE — Progress Notes (Signed)
Patient trained on how to give the injection to herself and well as if her husband needed to give it.  Instructions from box given to patient as well.    Injection given patient tolerated well.

## 2023-06-18 ENCOUNTER — Emergency Department (HOSPITAL_BASED_OUTPATIENT_CLINIC_OR_DEPARTMENT_OTHER)
Admission: EM | Admit: 2023-06-18 | Discharge: 2023-06-18 | Disposition: A | Discharge status: Home / Self Care | Payer: PRIVATE HEALTH INSURANCE | Attending: Emergency Medicine | Admitting: Emergency Medicine

## 2023-06-18 ENCOUNTER — Other Ambulatory Visit: Payer: Self-pay

## 2023-06-18 ENCOUNTER — Encounter (HOSPITAL_BASED_OUTPATIENT_CLINIC_OR_DEPARTMENT_OTHER): Payer: Self-pay | Admitting: Emergency Medicine

## 2023-06-18 ENCOUNTER — Encounter: Payer: Self-pay | Admitting: Neurology

## 2023-06-18 DIAGNOSIS — Z79899 Other long term (current) drug therapy: Secondary | ICD-10-CM | POA: Diagnosis not present

## 2023-06-18 DIAGNOSIS — Z8673 Personal history of transient ischemic attack (TIA), and cerebral infarction without residual deficits: Secondary | ICD-10-CM | POA: Insufficient documentation

## 2023-06-18 DIAGNOSIS — G43109 Migraine with aura, not intractable, without status migrainosus: Secondary | ICD-10-CM | POA: Insufficient documentation

## 2023-06-18 HISTORY — DX: Migraine, unspecified, not intractable, without status migrainosus: G43.909

## 2023-06-18 MED ORDER — PROCHLORPERAZINE EDISYLATE 10 MG/2ML IJ SOLN
10.0000 mg | Freq: Once | INTRAMUSCULAR | Status: AC
  Administered 2023-06-18: 10 mg via INTRAVENOUS
  Filled 2023-06-18: qty 2

## 2023-06-18 MED ORDER — DIPHENHYDRAMINE HCL 50 MG/ML IJ SOLN
25.0000 mg | Freq: Once | INTRAMUSCULAR | Status: AC
  Administered 2023-06-18: 25 mg via INTRAVENOUS
  Filled 2023-06-18: qty 1

## 2023-06-18 NOTE — ED Provider Notes (Signed)
[place] EMERGENCY DEPARTMENT AT [facility] Provider Note   CSN: [id number] Arrival date & time: 06/18/23  8841     History  Chief Complaint  Patient presents with   Migraine    Alexis Mills is a 27 y.o. female.  HPI    SUBJECTIVE:  Alexis Mills is a 27 y.o. female who complains of migraine headache for 3 hour(s). She has a well established history of recurrent migraines and also epilepsy.  Patient is on Keppra for epilepsy, it appears that she is on Topamax and rescue sumatriptan for migraines.  She was also recently started on a new injection for migraine prophylaxis.  Patient states that she gets migraines at least couple of times a week.  She has had to come to the ER once every 2 to 3 months for migraine flareups.  No specific triggers.  Description of pain: throbbing pain, generalized in location. Associated symptoms: light sensitivity. Patient has already taken sumatriptan for this headache without relief.  Patient states that she woke up at 5:30 in the morning and noticed the headache.  She took sumatriptan after she got ready for work.  The headaches started migrating down the back of her neck and she started noticing some numbness and discomfort over both of her shoulders.  She thereafter started noticing tingling like sensation in her left upper extremity.  With her migraines, she has not had any aura or neurologic deficits in the past.  Patient does not have any past history of stroke, TIA, and has no history of metabolic syndrome, substance use.  She is adopted and does not know her family past history.  No current facility-administered medications for this encounter.   Current Outpatient Medications  Medication Sig Dispense Refill   benzonatate (TESSALON) 100 MG capsule Take 1 capsule (100 mg total) by mouth 3 (three) times daily as needed for cough. 30 capsule 0   doxycycline (VIBRA-TABS) 100 MG tablet Take 1 tablet (100 mg total) by  mouth 2 (two) times daily. 14 tablet 0   Erenumab-aooe (AIMOVIG) 140 MG/ML SOAJ Inject 140 mg into the skin every 30 (thirty) days. 1.12 mL 11   famotidine (PEPCID) 20 MG tablet Take 1 tablet (20 mg total) by mouth 2 (two) times daily. For allergic reaction. 14 tablet 0   fexofenadine-pseudoephedrine (ALLEGRA-D 24) 180-240 MG per 24 hr tablet Take 1 tablet by mouth daily.     fluticasone (FLONASE) 50 MCG/ACT nasal spray Place 2 sprays into the nose 2 (two) times daily.     hydrOXYzine (ATARAX) 10 MG tablet Take 1 tablet (10 mg total) by mouth 3 (three) times daily as needed for itching. 30 tablet 0   levETIRAcetam (KEPPRA XR) 750 MG 24 hr tablet TAKE 2 TABLETS BY MOUTH AT  NIGHT 180 tablet 3   Multiple Vitamins-Minerals (MULTIVITAMIN WITH MINERALS) tablet Take 1 tablet by mouth daily.     NAYZILAM 5 MG/0.1ML SOLN Place 5 mg into the nose as needed. For seizures lasting longer than 5 minutes 5 each 5   ondansetron (ZOFRAN) 4 MG tablet Take 1 tablet (4 mg total) by mouth every 6 (six) hours. 12 tablet 0   promethazine-dextromethorphan (PROMETHAZINE-DM) 6.25-15 MG/5ML syrup Take 5 mLs by mouth 4 (four) times daily as needed for cough. 118 mL 0   SUMAtriptan (IMITREX) 50 MG tablet Take 1 tablet at onset of migraine. May repeat in 2 hours if headache persists or recurs. Do not take more than 3 a week 10 tablet  5   topiramate (TOPAMAX) 100 MG tablet Take 1 tablet BID 180 tablet 3       Home Medications Prior to Admission medications   Medication Sig Start Date End Date Taking? Authorizing Provider  benzonatate (TESSALON) 100 MG capsule Take 1 capsule (100 mg total) by mouth 3 (three) times daily as needed for cough. 05/13/23   Waldon Merl, PA-C  doxycycline (VIBRA-TABS) 100 MG tablet Take 1 tablet (100 mg total) by mouth 2 (two) times daily. 05/13/23   Waldon Merl, PA-C  Erenumab-aooe (AIMOVIG) 140 MG/ML SOAJ Inject 140 mg into the skin every 30 (thirty) days. 04/27/23   Van Clines,  MD  famotidine (PEPCID) 20 MG tablet Take 1 tablet (20 mg total) by mouth 2 (two) times daily. For allergic reaction. 11/25/21   Dulce Sellar, NP  fexofenadine-pseudoephedrine (ALLEGRA-D 24) 180-240 MG per 24 hr tablet Take 1 tablet by mouth daily.    [provider]  fluticasone (FLONASE) 50 MCG/ACT nasal spray Place 2 sprays into the nose 2 (two) times daily.    [provider]  hydrOXYzine (ATARAX) 10 MG tablet Take 1 tablet (10 mg total) by mouth 3 (three) times daily as needed for itching. 11/25/21   Dulce Sellar, NP  levETIRAcetam (KEPPRA XR) 750 MG 24 hr tablet TAKE 2 TABLETS BY MOUTH AT  NIGHT 04/27/23   Van Clines, MD  Multiple Vitamins-Minerals (MULTIVITAMIN WITH MINERALS) tablet Take 1 tablet by mouth daily.    [provider]  NAYZILAM 5 MG/0.1ML SOLN Place 5 mg into the nose as needed. For seizures lasting longer than 5 minutes 05/08/23   Van Clines, MD  ondansetron (ZOFRAN) 4 MG tablet Take 1 tablet (4 mg total) by mouth every 6 (six) hours. 04/29/23   Elmer Picker, NP  promethazine-dextromethorphan (PROMETHAZINE-DM) 6.25-15 MG/5ML syrup Take 5 mLs by mouth 4 (four) times daily as needed for cough. 04/29/23   Elmer Picker, NP  SUMAtriptan (IMITREX) 50 MG tablet Take 1 tablet at onset of migraine. May repeat in 2 hours if headache persists or recurs. Do not take more than 3 a week 04/27/23   Van Clines, MD  topiramate (TOPAMAX) 100 MG tablet Take 1 tablet BID 04/27/23   Van Clines, MD      Allergies    Other    Review of Systems   Review of Systems  Physical Exam Updated Vital Signs BP (!) 124/52   Pulse 74   Temp 98 F (36.7 C) (Oral)   Resp 20   Ht 5\' 1"  (1.549 m)   Wt 94.8 kg   LMP 05/27/2023 (Approximate)   SpO2 100%   BMI 39.49 kg/m  Physical Exam Vitals and nursing note reviewed.  Constitutional:      Appearance: She is well-developed.  HENT:     Head: Atraumatic.  Eyes:     Extraocular Movements:  Extraocular movements intact.     Pupils: Pupils are equal, round, and reactive to light.     Comments: No nystagmus, normal visual fields  Cardiovascular:     Rate and Rhythm: Normal rate.  Pulmonary:     Effort: Pulmonary effort is normal.  Musculoskeletal:     Cervical back: Normal range of motion and neck supple.  Skin:    General: Skin is warm and dry.  Neurological:     Mental Status: She is alert and oriented to person, place, and time.     Sensory: Sensory deficit present.  Motor: Weakness present.     Comments: Patient has subtle numbness to the left side of her face and also left upper extremity. Patient has a mild drift with motor exam on the left upper and lower extremity     ED Results / Procedures / Treatments   Labs (all labs ordered are listed, but only abnormal results are displayed) Labs Reviewed - No data to display  EKG None  Radiology No results found.  Procedures Procedures    Medications Ordered in ED Medications  prochlorperazine (COMPAZINE) injection 10 mg (10 mg Intravenous Given 06/18/23 0847)  diphenhydrAMINE (BENADRYL) injection 25 mg (25 mg Intravenous Given 06/18/23 0846)    ED Course/ Medical Decision Making/ A&P                                 Medical Decision Making Risk Prescription drug management.   27 year old female with history of migraines, epilepsy comes in with chief complaint of headaches that are consistent with her migraine with additional symptoms of left-sided numbness in the upper extremity and bilateral shoulder area/scapular area.  She does not have any history of aura with her migraine.  I have reviewed patient's outpatient records including recent visit with neurology.  I reviewed patient's past CT scans.  Patient has no stroke risk factors.  Differential diagnosis for this patient includes migraine with aura, complex migraine, stroke, myelopathy.  Other consideration included conditions like carotid  dissection, but deemed to be less likely.   ASSESSMENT:  Migraine headache -likely complex.  PLAN:  We will give patient headache cocktail for now. We will reassess her again after the headache cocktail.  If she continues to have persistent symptoms, then we will discuss the case with neurology or send her to ER for stroke rule out.   10:10 AM Upon reassessment, patient reports that the headache has improved, she actually got a short nap here.  Her numbness is completely resolved.  She does not feel weak on the left side.  She is comfortable going home.  Strict return precautions discussed, pt will return to the ER if there is visual complains, seizures, altered mental status, loss of consciousness, dizziness, new focal weakness, or numbness.  On repeat exam, patient had mild drift in the left lower extremity, but the strength was still 4+ out of 5 in there was no upper extremity drift at all.  She does not have any numbness that she experienced on the prior assessment over her face and upper extremity.   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

## 2023-06-18 NOTE — Discharge Instructions (Addendum)
We saw you in the emergency room for headache and numbness. We are glad that the numbness has resolved and your headache is improved.  At this time, we suspect that you had an episode of complex migraine or migraine with new aura.  We recommend that you follow-up with your primary care doctor and/or the neurologist.  If you start having any signs of stroke such as worsening or new one-sided weakness, numbness, slurred speech, vision change or constant dizziness, please return to the ER immediately.

## 2023-06-18 NOTE — ED Triage Notes (Signed)
Pt arrived POV, caox4, ambulatory c/o migraine since waking at approx 0530 this morning. PMH migraines, took Sumatriptan this morning with no relief.

## 2023-06-18 NOTE — ED Notes (Signed)
Rechecked patient. She is feeling much better. Headache pain 1/10 and reports weakness feeling to left arm has resolved.

## 2023-07-03 ENCOUNTER — Other Ambulatory Visit: Payer: Self-pay

## 2023-07-03 MED ORDER — SUMATRIPTAN SUCCINATE 50 MG PO TABS: ORAL_TABLET | ORAL | 5 refills | Status: DC

## 2023-08-28 ENCOUNTER — Ambulatory Visit: Payer: PRIVATE HEALTH INSURANCE | Admitting: Neurology

## 2023-09-04 ENCOUNTER — Encounter: Payer: Self-pay | Admitting: Neurology

## 2023-09-04 ENCOUNTER — Telehealth (INDEPENDENT_AMBULATORY_CARE_PROVIDER_SITE_OTHER): Payer: No Typology Code available for payment source | Admitting: Neurology

## 2023-09-04 DIAGNOSIS — G40B09 Juvenile myoclonic epilepsy, not intractable, without status epilepticus: Secondary | ICD-10-CM | POA: Diagnosis not present

## 2023-09-04 DIAGNOSIS — G43009 Migraine without aura, not intractable, without status migrainosus: Secondary | ICD-10-CM | POA: Diagnosis not present

## 2023-09-04 MED ORDER — AIMOVIG 140 MG/ML ~~LOC~~ SOAJ: 1.0000 | SUBCUTANEOUS | 11 refills | Status: DC

## 2023-09-04 MED ORDER — SUMATRIPTAN SUCCINATE 50 MG PO TABS: ORAL_TABLET | ORAL | 5 refills | Status: DC

## 2023-09-04 MED ORDER — TOPIRAMATE 100 MG PO TABS: ORAL_TABLET | ORAL | 3 refills | Status: DC

## 2023-09-04 MED ORDER — NAYZILAM 5 MG/0.1ML NA SOLN: 5.0000 mg | NASAL | 5 refills | Status: AC | PRN

## 2023-09-04 MED ORDER — LEVETIRACETAM ER 750 MG PO TB24: ORAL_TABLET | ORAL | 3 refills | Status: DC

## 2023-09-04 NOTE — Patient Instructions (Addendum)
Good to see you! Continue to monitor symptoms. Start a daily folic acid 1mg  tablet. Refills sent for all your medications. Follow-up in 4 months, call for any changes.    Seizure Precautions: 1. If medication has been prescribed for you to prevent seizures, take it exactly as directed.  Do not stop taking the medicine without talking to your doctor first, even if you have not had a seizure in a long time.   2. Avoid activities in which a seizure would cause danger to yourself or to others.  Don't operate dangerous machinery, swim alone, or climb in high or dangerous places, such as on ladders, roofs, or girders.  Do not drive unless your doctor says you may.  3. If you have any warning that you may have a seizure, lay down in a safe place where you can't hurt yourself.    4.  No driving for 6 months from last seizure, as per [facility].   Please refer to the following link on the Epile[employer] of [employer]'s website for more information: http://www.epilepsyfoundation.org/answerplace/Social/driving/drivingu.cfm   5.  Maintain good sleep hygiene. Avoid alcohol  6.  Notify your neurology if you are planning pregnancy or if you become pregnant.  7.  Contact your doctor if you have any problems that may be related to the medicine you are taking.  8.  Call 911 and bring the patient back to the ED if:        A.  The seizure lasts longer than 5 minutes.       B.  The patient doesn't awaken shortly after the seizure  C.  The patient has new problems such as difficulty seeing, speaking or moving  D.  The patient was injured during the seizure  E.  The patient has a temperature over 102 F (39C)  F.  The patient vomited and now is having trouble breathing

## 2023-09-04 NOTE — Progress Notes (Signed)
 Virtual Visit via Video Note The purpose of this virtual visit is to provide medical care while limiting exposure to the novel coronavirus.    Consent was obtained for video visit:  Yes.   Answered questions that patient had about telehealth interaction:  Yes.     Pt location: Home Physician Location: office Name of referring provider:  Dulce Sellar, NP I connected with Alexis Mills at patients initiation/request on 09/04/2023 at  4:00 PM EST by video enabled telemedicine application and verified that I am speaking with the correct person using two identifiers. Pt MRN:  [id number] Pt DOB:  1996-04-19 Video Participants:  Alexis Mills   History of Present Illness:  The patient had a virtual video visit on 09/04/2023. She was last seen in the neurology clinic 4 months ago for seizures and migraines. Since her last visit, she was started on Aimovig for migraine prophylaxis. She was in the ER on 06/18/23 for a migraine which migrated down her neck, shoulders, then she had left arm tingling. Head CT no acute changes. After migraine cocktail, symptoms improved, no further numbness, symptoms felt due to a complicated migraine. She is on Levetiracetam ER 1500mg  at bedtime and Topiramate 100mg  BID for seizure prophylaxis, no GTCs since 2021. She was having episodes of lightheadedness, dizziness, tremors. Her 50-hour EEG in 12/2022 was normal, however typical events were not captured. Symptoms felt to be more migraine-related rather than due to seizure. She has noticed a little bit of improvement with Aimovig, she is still getting them but not noticing the migraines as frequently, around 5-6 a month. She had a weird episode 2 weeks ago, she started feeling off on the way home, then her whole body felt off. There was no headache, no tremors, but she felt like how she would feels when coming out of a seizure. She at dinner then the feeling faded. She denies any falls. She usually gets 8-8.5  hours of sleep.    History on Initial Assessment 09/26/2021: This is a pleasant 28 year old right-handed woman with a history of migraines, Juvenile Myoclonic Epilepsy, presenting to establish adult epilepsy care. Records from her pediatric neurologist Dr. Devonne Doughty were reviewed. She started having recurrent generalized tonic-clonic seizures at age 28. She does not recall any prior warning to the first seizure, but since then she would have myoclonic jerks that she calls "tremors" progressing to a GTC. She was initially evaluated at [facility] and had an ambulatory and overnight inpatient EEG which were reportedly normal. She started seeing Dr. Devonne Doughty in 2014 after she had an abnormal EEG reporting a few episodes of photoparoxysmal response with sharp contoured waves during IPS mainly at 21 Hz with photoparoxysmal activity lasting 10 seconds without clinical correlate. She was started on Levetiracetam and became seizure-free for several years so medication was weaned down. In 2020, they reported 2 episodes of confusion and alteration of awareness occurring with severe headaches, no motor component. 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 LEV was weaned off until she had a seizure during her honeymoon in 2021. EEG at that time showed a few brief clusters of generalized discharges particularly during high-frequency IPS and during drowsiness/sleep. Levetiracetam ER 1500mg  qhs was restarted with no further GTCs since 2021. She denies any staring/unresponsive episodes. The myoclonic jerks had quieted down as well, until around a month ago when she started feeling very lightheaded before  going to bed. The symptoms eased up and she slept, but woke up very lightheaded the next morning and recalls having a couple of "tremors" while getting ready for work. The lightheadedness progressively worsened that she had to  leave work early and called out the next day. She recalls her head hurt and she felt so foggy, this eventually improved that afternoon but she had a lingering headache similar to how she felt after her seizures in the past. She denied feeling confused, no speech/comprehension difficulties. She was probably a little more stressed out from work that time, but denies any sleep deprivation, missed medications, or alcohol use. Prior to the recent episode, she was not having any headaches on Topiramate 50mg  qhs. She denies any olfactory/gustatory hallucinations, focal numbness/tingling/weakness. No diplopia, dysarthria/dysphagia, bowel/bladder dysfunction. 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 Memory is good. She usually gets 8 hours of sleep.  Epilepsy Risk Factors:  She was adopted, but was told her mother had substance abuse issues and as a newborn she had to be in "rehab for 6 months." There is no history of febrile convulsions, CNS infections such as meningitis/encephalitis, significant traumatic brain injury, neurosurgical procedures    Current Outpatient Medications on File Prior to Visit  Medication Sig Dispense Refill   benzonatate (TESSALON) 100 MG capsule Take 1 capsule (100 mg total) by mouth 3 (three) times daily as needed for cough. 30 capsule 0   doxycycline (VIBRA-TABS) 100 MG tablet Take 1 tablet (100 mg total) by mouth 2 (two) times daily. 14 tablet 0   Erenumab-aooe (AIMOVIG) 140 MG/ML SOAJ Inject 140 mg into the skin every 30 (thirty) days. 1.12 mL 11   famotidine (PEPCID) 20 MG tablet Take 1 tablet (20 mg total) by mouth 2 (two) times daily. For allergic reaction. 14 tablet 0   fexofenadine-pseudoephedrine (ALLEGRA-D 24) 180-240 MG per 24 hr tablet Take 1 tablet by mouth daily.     fluticasone (FLONASE) 50 MCG/ACT nasal spray Place 2 sprays into the nose 2 (two) times daily.     hydrOXYzine (ATARAX) 10 MG tablet Take 1 tablet (10 mg total) by mouth 3 (three) times daily as  needed for itching. 30 tablet 0   levETIRAcetam (KEPPRA XR) 750 MG 24 hr tablet TAKE 2 TABLETS BY MOUTH AT  NIGHT 180 tablet 3   Multiple Vitamins-Minerals (MULTIVITAMIN WITH MINERALS) tablet Take 1 tablet by mouth daily.     NAYZILAM 5 MG/0.1ML SOLN Place 5 mg into the nose as needed. For seizures lasting longer than 5 minutes 5 each 5   ondansetron (ZOFRAN) 4 MG tablet Take 1 tablet (4 mg total) by mouth every 6 (six) hours. 12 tablet 0   promethazine-dextromethorphan (PROMETHAZINE-DM) 6.25-15 MG/5ML syrup Take 5 mLs by mouth 4 (four) times daily as needed for cough. 118 mL 0   SUMAtriptan (IMITREX) 50 MG tablet Take 1 tablet at onset of migraine. May repeat in 2 hours if headache persists or recurs. Do not take more than 3 a week 10 tablet 5   topiramate (TOPAMAX) 100 MG tablet Take 1 tablet BID 180 tablet 3   No current facility-administered medications on file prior to visit.     Observations/Objective:   GEN:  The patient appears stated age and is in NAD. Neurological examination: Patient is awake, alert. No aphasia or dysarthria. Intact fluency and comprehension. Cranial nerves: Extraocular movements intact. No facial asymmetry. Motor: moves all extremities symmetrically, at least anti-gravity x 4.   Assessment and Plan:  This is a pleasant 28 yo RH woman with a history of migraines and Juvenile Myoclonic Epilepsy. No GTCs since 2021. She has been having episodes of lightheadedness/dizziness, some with tremors, most associated with headaches. Her ambulatory 50-hour EEG was normal. Episodes appear more migraine-related, however sometimes she feels it would lead to a seizure. She is doing well on Levetiracetam ER 750mg  2 tabs at bedtime (1500mg  at bedtime) and Topiramate 100mg  BID. There may be some improvement with Aimovig but she continues to report 5-6 migraines a month, we agreed to continue to monitor symptoms as she only started Aimovig around 3 months ago. She has prn sumatriptan for  rescue. She reported a different episode 2 weeks ago, unclear if more related to hypoglycemia, continue to monitor. We again discussed issues in women with epilepsy, advised to start a daily folic acid 1mg . She is aware of [place] driving laws to stop driving after a seizure until 6 months seizure-free. Follow-up in 4 months, call for any changes.    Follow Up Instructions:   -I discussed the assessment and treatment plan with the patient. The patient was provided an opportunity to ask questions and all were answered. The patient agreed with the plan and demonstrated an understanding of the instructions.   The patient was advised to call back or seek an in-person evaluation if the symptoms worsen or if the condition fails to improve as anticipated.     Van Clines, MD

## 2023-10-12 ENCOUNTER — Encounter: Payer: Self-pay | Admitting: Family

## 2023-10-12 ENCOUNTER — Ambulatory Visit: Admitting: Family

## 2023-10-12 VITALS — BP 131/82 | HR 96 | Temp 98.0°F | Ht 61.0 in | Wt 206.5 lb

## 2023-10-12 DIAGNOSIS — J020 Streptococcal pharyngitis: Secondary | ICD-10-CM

## 2023-10-12 LAB — POCT RAPID STREP A (OFFICE): Rapid Strep A Screen: POSITIVE — AB

## 2023-10-12 MED ORDER — AMOXICILLIN 500 MG PO CAPS
500.0000 mg | ORAL_CAPSULE | Freq: Two times a day (BID) | ORAL | 0 refills | Status: AC
Start: 2023-10-12 — End: 2023-10-22

## 2023-10-12 NOTE — Progress Notes (Signed)
 Patient ID: Ginny Loomer, female    DOB: 1996/03/06, 28 y.o.   MRN: [id number]  Chief Complaint  Patient presents with   Sore Throat    Pt c/o sore throat, hoarseness and nasal congestion for 3 days. Has tried mucinex cold/flu and tylenol and ibuprofen which did not help sx.   Discussed the use of AI scribe software for clinical note transcription with the patient, who gave verbal consent to proceed.  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 She describes the discomfort as a sensation of 'something sitting on my chest' and notes that it was severe enough to cause difficulty speaking upon waking. She denies fever with this episode, but reports multiple previous episodes of similar symptoms, with positive strep tests in April and July of the previous year. The patient also mentions a history of tonsillitis, with at least one episode in the year prior to the previous year's episodes. She expresses frustration with the recurrent infections and inquires about the possibility of tonsillectomy.  Assessment & Plan Streptococcal pharyngitis - Recurrent streptococcal pharyngitis with current symptoms of sore throat, difficulty speaking, and tender lymphadenopathy. No fever reported. Physical examination shows pharyngeal erythema and a white patch on the left tonsil. She experienced two confirmed episodes last year. Discussed tonsillectomy, noting its potential benefits and drawbacks, including the tonsils' protective role against infections and the criteria for surgery, typically three to four episodes per year. Will consult with an ENT specialist regarding tonsillectomy due to frequent infections. - Prescribing amoxicillin 500mg  bid x 10d, and instructed to complete the full course. - Advise on ibuprofen, up to three tablets at a time, three times daily  after eating for analgesia for next 2 days. - Encourage increased fluid intake. - Contact ENT specialist to discuss if tonsillectomy recommended. - Provide work note for absence until Wednesday. -Call office if no improvement after finishing abt.   Subjective:    Outpatient Medications Prior to Visit  Medication Sig Dispense Refill   Erenumab-aooe (AIMOVIG) 140 MG/ML SOAJ Inject 140 mg into the skin every 30 (thirty) days. 1.12 mL 11   fexofenadine-pseudoephedrine (ALLEGRA-D 24) 180-240 MG per 24 hr tablet Take 1 tablet by mouth daily.     fluticasone (FLONASE) 50 MCG/ACT nasal spray Place 2 sprays into the nose 2 (two) times daily.     levETIRAcetam (KEPPRA XR) 750 MG 24 hr tablet TAKE 2 TABLETS BY MOUTH AT  NIGHT 180 tablet 3   Multiple Vitamins-Minerals (MULTIVITAMIN WITH MINERALS) tablet Take 1 tablet by mouth daily.     NAYZILAM 5 MG/0.1ML SOLN Place 5 mg into the nose as needed. For seizures lasting longer than 5 minutes 5 each 5   ondansetron (ZOFRAN) 4 MG tablet Take 1 tablet (4 mg total) by mouth every 6 (six) hours. 12 tablet 0   SUMAtriptan (IMITREX) 50 MG tablet Take 1 tablet at onset of migraine. May repeat in 2 hours if headache persists or recurs. Do not take more than 3 a week 10 tablet 5   topiramate (TOPAMAX) 100 MG tablet Take 1 tablet BID 180 tablet 3   No facility-administered medications prior to visit.   Past Medical History:  Diagnosis Date   ADD (attention deficit disorder)    Migraine    Seizures (HCC)    Past Surgical History:  Procedure Laterality Date   COLPOSCOPY  09/06/2019  NO PAST SURGERIES     Allergies  Allergen Reactions   Other     Seasonal Allergies      Objective:    Physical Exam Vitals and nursing note reviewed.  Constitutional:      Appearance: Normal appearance. She is ill-appearing.     Interventions: Face mask in place.  HENT:     Right Ear: Tympanic membrane and ear canal normal.     Left Ear: Tympanic membrane and ear  canal normal.     Nose:     Right Sinus: Frontal sinus tenderness present.     Left Sinus: Frontal sinus tenderness present.     Mouth/Throat:     Mouth: Mucous membranes are moist.     Pharynx: Pharyngeal swelling and posterior oropharyngeal erythema present. No oropharyngeal exudate or uvula swelling.     Tonsils: Tonsillar exudate present. No tonsillar abscesses. 2+ on the right. 2+ on the left.  Cardiovascular:     Rate and Rhythm: Normal rate and regular rhythm.  Pulmonary:     Effort: Pulmonary effort is normal.     Breath sounds: Normal breath sounds.  Musculoskeletal:        General: Normal range of motion.  Lymphadenopathy:     Head:     Right side of head: Submandibular and tonsillar adenopathy present. No preauricular or posterior auricular adenopathy.     Left side of head: Submandibular and tonsillar adenopathy present. No preauricular or posterior auricular adenopathy.     Cervical: Cervical adenopathy present.     Right cervical: Superficial cervical adenopathy present.     Left cervical: Superficial cervical adenopathy present.  Skin:    General: Skin is warm and dry.  Neurological:     Mental Status: She is alert.  Psychiatric:        Mood and Affect: Mood normal.        Behavior: Behavior normal.    BP 131/82 (BP Location: Left Arm, Patient Position: Sitting, Cuff Size: Large)   Pulse 96   Temp 98 F (36.7 C) (Temporal)   Ht 5\' 1"  (1.549 m)   Wt 206 lb 8 oz (93.7 kg)   LMP 10/01/2023 (Approximate)   SpO2 99%   BMI 39.02 kg/m  Wt Readings from Last 3 Encounters:  10/12/23 206 lb 8 oz (93.7 kg)  06/18/23 209 lb (94.8 kg)  04/27/23 209 lb 6.4 oz (95 kg)       Dulce Sellar, NP

## 2023-10-22 ENCOUNTER — Encounter: Payer: Self-pay | Admitting: Neurology

## 2023-11-02 ENCOUNTER — Encounter: Payer: Self-pay | Admitting: Neurology

## 2023-11-03 ENCOUNTER — Other Ambulatory Visit: Payer: Self-pay

## 2023-11-03 MED ORDER — SUMATRIPTAN SUCCINATE 50 MG PO TABS: ORAL_TABLET | ORAL | 1 refills | Status: DC

## 2023-11-21 ENCOUNTER — Encounter: Payer: Self-pay | Admitting: Family

## 2023-11-24 NOTE — Telephone Encounter (Signed)
 does require an OV or can be virtual

## 2023-11-29 ENCOUNTER — Ambulatory Visit
Admission: RE | Admit: 2023-11-29 | Discharge: 2023-11-29 | Disposition: A | Discharge status: Home / Self Care | Source: Ambulatory Visit | Attending: Emergency Medicine | Admitting: Emergency Medicine

## 2023-11-29 VITALS — BP 134/68 | HR 83 | Temp 98.3°F | Resp 16

## 2023-11-29 DIAGNOSIS — J029 Acute pharyngitis, unspecified: Secondary | ICD-10-CM | POA: Diagnosis not present

## 2023-11-29 LAB — POCT RAPID STREP A (OFFICE): Rapid Strep A Screen: NEGATIVE

## 2023-11-29 MED ORDER — LIDOCAINE VISCOUS HCL 2 % MT SOLN: 15.0000 mL | OROMUCOSAL | 0 refills | Status: DC | PRN

## 2023-11-29 NOTE — ED Triage Notes (Signed)
 Patient presents to UC for sore throat since today, nasal congestion since monday. Hx of allergies. Treating symptoms with allergy meds.

## 2023-11-29 NOTE — ED Provider Notes (Signed)
 [facility]    CSN: [id number] Arrival date & time: 11/29/23  6440     History   Chief Complaint Chief Complaint  Patient presents with   Sore Throat    Sore throat started this morning but nasal congestion off and on all week since Monday - Entered by patient    HPI Alexis Mills is a 28 y.o. female.  Sore throat started this morning. 8/10 pain with swallowing Nasal congestion for 5-6 days Not having fever or cough. 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 Authorizing Provider  lidocaine (XYLOCAINE) 2 % solution Use as directed 15 mLs in the mouth or throat every 3 (three) hours as needed for mouth pain. Swish/gargle and spit out 11/29/23  Yes Azriel Dancy, Ivette Marks, PA-C  Erenumab -aooe (AIMOVIG ) 140 MG/ML SOAJ Inject 140 mg into the skin every 30 (thirty) days. 09/04/23   Jhonny Moss, MD  fexofenadine-pseudoephedrine (ALLEGRA-D 24) 180-240 MG per 24 hr tablet Take 1 tablet by mouth daily.     [provider]  fluticasone (FLONASE) 50 MCG/ACT nasal spray Place 2 sprays into the nose 2 (two) times daily.    [provider]  levETIRAcetam  (KEPPRA  XR) 750 MG 24 hr tablet TAKE 2 TABLETS BY MOUTH AT  NIGHT 09/04/23   Jhonny Moss, MD  Multiple Vitamins-Minerals (MULTIVITAMIN WITH MINERALS) tablet Take 1 tablet by mouth daily.    [provider]  NAYZILAM  5 MG/0.1ML SOLN Place 5 mg into the nose as needed. For seizures lasting longer than 5 minutes 09/04/23   Jhonny Moss, MD  ondansetron  (ZOFRAN ) 4 MG tablet Take 1 tablet (4 mg total) by mouth every 6 (six) hours. 04/29/23   Imogene Mana, NP  SUMAtriptan  (IMITREX ) 50 MG tablet Take 1 tablet at onset of migraine. May repeat in 2 hours if headache persists or recurs. Do not take more than 3 a week 11/03/23   Jhonny Moss, MD  topiramate  (TOPAMAX ) 100 MG tablet Take 1 tablet BID 09/04/23   Jhonny Moss, MD    Family History Family History  Adopted: Yes    Social History Social History   Tobacco Use   Smoking status: Never    Passive exposure: Never  Smokeless tobacco: Never  Vaping Use   Vaping status: Never Used  Substance Use Topics   Alcohol use: Not Currently   Drug use: No     Allergies   Other   Review of Systems Review of Systems Per HPI  Physical Exam Triage Vital Signs ED Triage Vitals [11/29/23 0944]  Encounter Vitals Group     BP 134/68     Systolic BP Percentile      Diastolic BP Percentile      Pulse Rate 83     Resp 16     Temp 98.3 F (36.8 C)     Temp Source Oral     SpO2 95 %     Weight      Height      Head Circumference      Peak Flow      Pain Score      Pain Loc      Pain Education      Exclude from Growth Chart    No data found.  Updated Vital Signs BP 134/68 (BP Location: Right Arm)   Pulse 83   Temp 98.3 F (36.8 C) (Oral)   Resp 16   LMP 11/27/2023   SpO2 95%    Physical Exam Vitals and nursing note reviewed.  Constitutional:       Appearance: She is not ill-appearing.  HENT:     Right Ear: Tympanic membrane and ear canal normal.     Left Ear: Tympanic membrane and ear canal normal.     Nose: No congestion or rhinorrhea.     Mouth/Throat:     Mouth: Mucous membranes are moist.     Pharynx: Oropharynx is clear. No posterior oropharyngeal erythema.     Tonsils: No tonsillar exudate. 1+ on the right. 1+ on the left.     Comments: 1+ tonsils without erythema or exudate Eyes:     Conjunctiva/sclera: Conjunctivae normal.  Cardiovascular:     Rate and Rhythm: Normal rate and regular rhythm.     Pulses: Normal pulses.     Heart sounds: Normal heart sounds.  Pulmonary:     Effort: Pulmonary effort is normal.     Breath sounds: Normal breath sounds.  Musculoskeletal:     Cervical back: Normal range of motion. No rigidity or tenderness.  Lymphadenopathy:     Cervical: No cervical adenopathy.  Skin:    General: Skin is warm and dry.  Neurological:     Mental Status: She is alert and oriented to person, place, and time.     UC Treatments / Results  Labs (all labs ordered are listed, but only abnormal results are displayed) Labs Reviewed  POCT RAPID STREP A (OFFICE) - Normal  CULTURE, GROUP A STREP [facility])    EKG   Radiology No results found.  Procedures Procedures (including critical care time)  Medications Ordered in UC Medications - No data to display  Initial Impression / Assessment and Plan / UC Course  I have reviewed the triage vital signs and the nursing notes.  Pertinent labs & imaging results that were available during my care of the patient were reviewed by me and considered in my medical decision making (see chart for details).  Afebrile, well-appearing.  Rapid strep negative.  Pending culture.  Discussed viral etiology, supportive and symptomatic care.  Will contact patient if throat culture requires different treatment.  Note for work is provided.  Return precautions  Final Clinical  Impressions(s) / UC Diagnoses  Final diagnoses:  Sore throat     Discharge Instructions      Ibuprofen can be alternated with tylenol every 4-6 hours. Salt water gargles, lozenges, or throat spray Try lidocaine gargle and spit as often as needed  We will call you if anything returns on your throat culture (2-3 days)  Mucinex with lots of water for congestion!   ED Prescriptions     Medication Sig Dispense Auth. Provider   lidocaine (XYLOCAINE) 2 % solution Use as directed 15 mLs in the mouth or throat every 3 (three) hours as needed for mouth pain. Swish/gargle and spit out 100 mL Pierce Barocio, Ivette Marks, PA-C      PDMP not reviewed this encounter.   Kevonta Phariss, Ivette Marks, PA-C 11/29/23 1045

## 2023-11-29 NOTE — Discharge Instructions (Addendum)
 Ibuprofen can be alternated with tylenol every 4-6 hours. Salt water gargles, lozenges, or throat spray Try lidocaine gargle and spit as often as needed  We will call you if anything returns on your throat culture (2-3 days)  Mucinex with lots of water for congestion!

## 2023-12-02 ENCOUNTER — Ambulatory Visit: Payer: Self-pay

## 2023-12-02 LAB — CULTURE, GROUP A STREP (THRC)

## 2024-01-05 ENCOUNTER — Ambulatory Visit: Payer: PRIVATE HEALTH INSURANCE | Admitting: Neurology

## 2024-01-19 ENCOUNTER — Encounter: Payer: Self-pay | Admitting: Neurology

## 2024-01-20 ENCOUNTER — Ambulatory Visit: Payer: PRIVATE HEALTH INSURANCE | Admitting: Neurology

## 2024-01-26 ENCOUNTER — Encounter: Payer: Self-pay | Admitting: Neurology

## 2024-01-26 ENCOUNTER — Ambulatory Visit: Payer: PRIVATE HEALTH INSURANCE | Admitting: Neurology

## 2024-01-26 VITALS — BP 128/84 | HR 79 | Ht 61.0 in | Wt 212.0 lb

## 2024-01-26 DIAGNOSIS — G40B09 Juvenile myoclonic epilepsy, not intractable, without status epilepticus: Secondary | ICD-10-CM

## 2024-01-26 DIAGNOSIS — G43009 Migraine without aura, not intractable, without status migrainosus: Secondary | ICD-10-CM

## 2024-01-26 MED ORDER — NORTRIPTYLINE HCL 10 MG PO CAPS: ORAL_CAPSULE | ORAL | 5 refills | Status: DC

## 2024-01-26 MED ORDER — SUMATRIPTAN SUCCINATE 50 MG PO TABS: ORAL_TABLET | ORAL | 3 refills | Status: AC

## 2024-01-26 MED ORDER — TOPIRAMATE 100 MG PO TABS: ORAL_TABLET | ORAL | 3 refills | Status: AC

## 2024-01-26 MED ORDER — LEVETIRACETAM ER 750 MG PO TB24: ORAL_TABLET | ORAL | 3 refills | Status: AC

## 2024-01-26 NOTE — Patient Instructions (Signed)
 Good to see you!  Start Nortriptyline  10mg : take 1 capsule every night for 2 weeks, then increase to 2 capsules every night. Keep a calendar of the migraines and update me in a month how you are doing.   2. Start daily folic acid 1000mcg (1mg ) daily  3. Continue all your other medications  4. Follow-up in 4 months, call for any changes   Seizure Precautions: 1. If medication has been prescribed for you to prevent seizures, take it exactly as directed.  Do not stop taking the medicine without talking to your doctor first, even if you have not had a seizure in a long time.   2. Avoid activities in which a seizure would cause danger to yourself or to others.  Don't operate dangerous machinery, swim alone, or climb in high or dangerous places, such as on ladders, roofs, or girders.  Do not drive unless your doctor says you may.  3. If you have any warning that you may have a seizure, lay down in a safe place where you can't hurt yourself.    4.  No driving for 6 months from last seizure, as per [place]  state law.   Please refer to the following link on the [employer]'s website for more information: http://www.epilepsyfoundation.org/answerplace/Social/driving/drivingu.cfm   5.  Maintain good sleep hygiene. Avoid alcohol.  6.  Notify your neurology if you are planning pregnancy or if you become pregnant.  7.  Contact your doctor if you have any problems that may be related to the medicine you are taking.  8.  Call 911 and bring the patient back to the ED if:        A.  The seizure lasts longer than 5 minutes.       B.  The patient doesn't awaken shortly after the seizure  C.  The patient has new problems such as difficulty seeing, speaking or moving  D.  The patient was injured during the seizure  E.  The patient has a temperature over 102 F (39C)  F.  The patient vomited and now is having trouble breathing

## 2024-01-26 NOTE — Progress Notes (Signed)
 NEUROLOGY FOLLOW UP OFFICE NOTE  Alexis Mills [id number] [birth year]  HISTORY OF PRESENT ILLNESS: I had the pleasure of seeing [name] in follow-up in the neurology clinic on 01/26/2024.  The patient was last seen 5 months ago for seizures and migraines. She is again accompanied by her husband who helps supplement the history today.  Records and images were personally reviewed where available.  Since her last visit, she denies any GTCs since 2021. She was previously reporting feeling of a seizure, these have quieted down as well. She is on Levetiracetam  ER 1500mg  at bedtime and Topiramate  100mg  BID without side effects. She continues to report frequent migraines, occurring at least daily from an hour to all day. Last Saturday migraine lasted all day, it got bad it almost felt like vertigo with spinning and balance changes.She has one right now. She takes Tylenol with a little caffeine which helps a little knock most of the migraine down, taking around 3 a week. She was on Aimovig  but it was cost-prohibitive despite insurance coverage. Last injection was in February. She gets 8-8.5 hours of sleep. Mood is pretty good.    History on Initial Assessment 09/26/2021: This is a pleasant 28 year old right-handed woman with a history of migraines, Juvenile Myoclonic Epilepsy, presenting to establish adult epilepsy care. Records from her pediatric neurologist [name] were reviewed. She started having recurrent generalized tonic-clonic seizures at age 28. She does not recall any prior warning to the first seizure, but since then she would have myoclonic jerks that she calls tremors progressing to a GTC. She was initially evaluated at [name] and had an ambulatory and overnight inpatient EEG which were reportedly normal. She started seeing [name] in 2014 after she had an abnormal EEG reporting a few episodes of photoparoxysmal response with sharp contoured waves during IPS mainly at 21 Hz  with photoparoxysmal activity lasting 10 seconds without clinical correlate. She was started on Levetiracetam  and became seizure-free for several years so medication was weaned down. In 2020, they reported 2 episodes of confusion and alteration of awareness occurring with severe headaches, no motor component. She was on low dose LEV 350mg  qhs at that time, repeat EEG was normal, and she was started on Topiramate  for migraine variant/confusional migraine. LEV was weaned off until she had a seizure during her honeymoon in 2021. EEG at that time showed a few brief clusters of generalized discharges particularly during high-frequency IPS and during drowsiness/sleep. Levetiracetam  ER 1500mg  qhs was restarted with no further GTCs since 2021. She denies any staring/unresponsive episodes. The myoclonic jerks had quieted down as well, until around a month ago when she started feeling very lightheaded before going to bed. The symptoms eased up and she slept, but woke up very lightheaded the next morning and recalls having a couple of tremors while getting ready for work. The lightheadedness progressively worsened that she had to leave work early and called out the next day. She recalls her head hurt and she felt so foggy, this eventually improved that afternoon but she had a lingering headache similar to how she felt after her seizures in the past. She denied feeling confused, no speech/comprehension difficulties. She was probably a little more stressed out from work that time, but denies any sleep deprivation, missed medications, or alcohol use. Prior to the recent episode, she was not having any headaches on Topiramate  50mg  qhs. She denies any olfactory/gustatory hallucinations, focal numbness/tingling/weakness. No diplopia, dysarthria/dysphagia, bowel/bladder dysfunction. 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 Memory is good. She usually gets 8 hours of sleep.  Epilepsy Risk Factors:  She was adopted, but  was told her mother had substance abuse issues and as a newborn she had to be in rehab for 6 months. There is no history of febrile convulsions, CNS infections such as meningitis/encephalitis, significant traumatic brain injury, neurosurgical procedures  PAST MEDICAL HISTORY: Past Medical History:  Diagnosis Date   ADD (attention deficit disorder)    Migraine    Seizures (HCC)     MEDICATIONS: Current Outpatient Medications on File Prior to Visit  Medication Sig Dispense Refill   fexofenadine-pseudoephedrine (ALLEGRA-D 24) 180-240 MG per 24 hr tablet Take 1 tablet by mouth daily.     fluticasone (FLONASE) 50 MCG/ACT nasal spray Place 2 sprays into the nose 2 (two) times daily.     levETIRAcetam  (KEPPRA  XR) 750 MG 24 hr tablet TAKE 2 TABLETS BY MOUTH AT  NIGHT 180 tablet 3   Multiple Vitamins-Minerals (MULTIVITAMIN WITH MINERALS) tablet Take 1 tablet by mouth daily.     NAYZILAM  5 MG/0.1ML SOLN Place 5 mg into the nose as needed. For seizures lasting longer than 5 minutes 5 each 5   SUMAtriptan  (IMITREX ) 50 MG tablet Take 1 tablet at onset of migraine. May repeat in 2 hours if headache persists or recurs. Do not take more than 3 a week 30 tablet 1   Erenumab -aooe (AIMOVIG ) 140 MG/ML SOAJ Inject 140 mg into the skin every 30 (thirty) days. (Patient not taking: Reported on 01/26/2024) 1.12 mL 11   lidocaine  (XYLOCAINE ) 2 % solution Use as directed 15 mLs in the mouth or throat every 3 (three) hours as needed for mouth pain. Swish/gargle and spit out 100 mL 0   ondansetron  (ZOFRAN ) 4 MG tablet Take 1 tablet (4 mg total) by mouth every 6 (six) hours. (Patient not taking: Reported on 01/26/2024) 12 tablet 0   topiramate  (TOPAMAX ) 100 MG tablet Take 1 tablet BID 180 tablet 3   No current facility-administered medications on file prior to visit.    ALLERGIES: Allergies  Allergen Reactions   Other     Seasonal Allergies    FAMILY HISTORY: Family History  Adopted: Yes    SOCIAL  HISTORY: Social History   Socioeconomic History   Marital status: Married    Spouse name: Not on file   Number of children: Not on file   Years of education: Not on file   Highest education level: 12th grade  Occupational History   Occupation: Preschool teacher    Comment: [employer]  Tobacco Use   Smoking status: Never    Passive exposure: Never   Smokeless tobacco: Never  Vaping Use   Vaping status: Never Used  Substance and Sexual Activity   Alcohol use: Not Currently   Drug use: No   Sexual activity: Yes    Birth control/protection: Pill  Other Topics Concern   Not on file  Social History Narrative   Elisabet attends [employer]. She is doing well.   Patient was adopted at 28 year of age.   Right handed   Social Drivers of Health   Financial Resource Strain: Low Risk  (10/12/2023)   Overall Financial Resource Strain (CARDIA)    Difficulty of Paying Living Expenses: Not hard at all  Food Insecurity: No Food Insecurity (10/12/2023)   Hunger Vital Sign    Worried About Running Out of Food in the Last Year: Never true  Ran Out of Food in the Last Year: Never true  Transportation Needs: No Transportation Needs (10/12/2023)   PRAPARE - Administrator, Civil Service (Medical): No    Lack of Transportation (Non-Medical): No  Physical Activity: Insufficiently Active (10/12/2023)   Exercise Vital Sign    Days of Exercise per Week: 2 days    Minutes of Exercise per Session: 30 min  Stress: No Stress Concern Present (10/12/2023)   [employer] of Occupational Health - Occupational Stress Questionnaire    Feeling of Stress : Only a little  Social Connections: Moderately Integrated (10/12/2023)   Social Connection and Isolation Panel    Frequency of Communication with Friends and Family: More than three times a week    Frequency of Social Gatherings with Friends and Family: Three times a week    Attends Religious Services: More than  4 times per year    Active Member of Clubs or Organizations: No    Attends Banker Meetings: Not on file    Marital Status: Married  Intimate Partner Violence: Unknown (10/18/2021)   Received from [facility]   HITS    Physically Hurt: Not on file    Insult or Talk Down To: Not on file    Threaten Physical Harm: Not on file    Scream or Curse: Not on file     PHYSICAL EXAM: Vitals:   01/26/24 1500  BP: 128/84   General: No acute distress Head:  Normocephalic/atraumatic Skin/Extremities: No rash, no edema Neurological Exam: alert and awake. No aphasia or dysarthria. Fund of knowledge is appropriate. Attention and concentration are normal.   Cranial nerves: Pupils equal, round. Extraocular movements intact with no nystagmus. Visual fields full.  No facial asymmetry.  Motor: Bulk and tone normal, muscle strength 5/5 throughout with no pronator drift.   Finger to nose testing intact.  Gait narrow-based and steady, able to tandem walk adequately.  Romberg negative.   IMPRESSION: This is a pleasant 28 yo RH woman with a history of migraines and Juvenile Myoclonic Epilepsy. She is doing well from a seizure standpoint with no GTCs since 2021, no auras. Continue Levetiracetam  ER 1500mg  at bedtime and Topiramate  100mg  BID. She is having daily migraines, Aimovig  was cost-prohibitive. We discussed starting nortriptyline  10mg  at bedtime for 2 weeks, then increase to 20mg  at bedtime. Side effects discussed. She was advised to keep a calendar of her migraines. No current pregnancy plans, we discussed issues in women with epilepsy, start daily folic acid 1mg . She has prn sumatriptan  for migraine rescue. She is aware of  driving laws to stop driving after a seizure until 6 months seizure-free. Follow-up in 4 months, call for any changes.   Thank you for allowing me to participate in her care.  Please do not hesitate to call for any questions or concerns.    Darice Shivers, M.D.   CC:  Corean Comment, NP

## 2024-01-27 NOTE — Telephone Encounter (Signed)
**Note De-identified  [place] Obfuscation** Please advise 

## 2024-02-07 ENCOUNTER — Encounter: Payer: Self-pay | Admitting: Neurology

## 2024-02-25 ENCOUNTER — Encounter: Payer: Self-pay | Admitting: Neurology

## 2024-02-26 MED ORDER — NORTRIPTYLINE HCL 10 MG PO CAPS: ORAL_CAPSULE | ORAL | 3 refills | Status: AC

## 2024-02-28 ENCOUNTER — Telehealth: Admitting: Nurse Practitioner

## 2024-02-28 DIAGNOSIS — B9789 Other viral agents as the cause of diseases classified elsewhere: Secondary | ICD-10-CM

## 2024-02-28 DIAGNOSIS — J019 Acute sinusitis, unspecified: Secondary | ICD-10-CM | POA: Diagnosis not present

## 2024-02-28 MED ORDER — IPRATROPIUM BROMIDE 0.03 % NA SOLN
2.0000 | Freq: Two times a day (BID) | NASAL | 0 refills | Status: AC
Start: 2024-02-28 — End: ?

## 2024-02-28 NOTE — Progress Notes (Signed)
 I have spent 5 minutes in review of e-visit questionnaire, review and updating patient chart, medical decision making and response to patient.   Claiborne Rigg, NP

## 2024-02-28 NOTE — Progress Notes (Signed)
 E-Visit for Sinus Problems  We are sorry that you are not feeling well.  Here is how we plan to help!  Based on what you have shared with me it looks like you have sinusitis.  Sinusitis is inflammation and infection in the sinus cavities of the head.  Based on your presentation I believe you most likely have Acute Viral Sinusitis.This is an infection most likely caused by a virus. There is not specific treatment for viral sinusitis other than to help you with the symptoms until the infection runs its course.  You may use an oral decongestant such as Mucinex D or if you have glaucoma or high blood pressure use plain Mucinex. Saline nasal spray help and can safely be used as often as needed for congestion, I have prescribed: Ipratropium Bromide  nasal spray 0.03% 2 sprays in eah nostril 2-3 times a day. If you are not feeling better in 5- 7days please feel free to reach back out to us   Some authorities believe that zinc sprays or the use of Echinacea may shorten the course of your symptoms.  Sinus infections are not as easily transmitted as other respiratory infection, however we still recommend that you avoid close contact with loved ones, especially the very young and elderly.  Remember to wash your hands thoroughly throughout the day as this is the number one way to prevent the spread of infection!  Home Care: Only take medications as instructed by your medical team. Do not take these medications with alcohol. A steam or ultrasonic humidifier can help congestion.  You can place a towel over your head and breathe in the steam from hot water coming from a faucet. Avoid close contacts especially the very young and the elderly. Cover your mouth when you cough or sneeze. Always remember to wash your hands.  Get Help Right Away If: You develop worsening fever or sinus pain. You develop a severe head ache or visual changes. Your symptoms persist after you have completed your treatment plan.  Make  sure you Understand these instructions. Will watch your condition. Will get help right away if you are not doing well or get worse.   Thank you for choosing an e-visit.  Your e-visit answers were reviewed by a board certified advanced clinical practitioner to complete your personal care plan. Depending upon the condition, your plan could have included both over the counter or prescription medications.  Please review your pharmacy choice. Make sure the pharmacy is open so you can pick up prescription now. If there is a problem, you may contact your provider through [employer] and have the prescription routed to another pharmacy.  Your safety is important to us . If you have drug allergies check your prescription carefully.   For the next 24 hours you can use MyChart to ask questions about today's visit, request a non-urgent call back, or ask for a work or school excuse. You will get an email in the next two days asking about your experience. I hope that your e-visit has been valuable and will speed your recovery.

## 2024-02-29 ENCOUNTER — Ambulatory Visit (HOSPITAL_COMMUNITY)
Admission: RE | Admit: 2024-02-29 | Discharge: 2024-02-29 | Disposition: A | Discharge status: Home / Self Care | Source: Ambulatory Visit | Attending: Emergency Medicine | Admitting: Emergency Medicine

## 2024-02-29 ENCOUNTER — Encounter (HOSPITAL_COMMUNITY): Payer: Self-pay

## 2024-02-29 VITALS — BP 133/88 | HR 96 | Temp 98.3°F | Resp 18

## 2024-02-29 DIAGNOSIS — J069 Acute upper respiratory infection, unspecified: Secondary | ICD-10-CM | POA: Diagnosis not present

## 2024-02-29 DIAGNOSIS — R0981 Nasal congestion: Secondary | ICD-10-CM | POA: Diagnosis not present

## 2024-02-29 LAB — POC SARS CORONAVIRUS 2 AG -  ED: SARS Coronavirus 2 Ag: NEGATIVE

## 2024-02-29 MED ORDER — BENZONATATE 100 MG PO CAPS: 100.0000 mg | ORAL_CAPSULE | Freq: Three times a day (TID) | ORAL | 0 refills | Status: DC | PRN

## 2024-02-29 MED ORDER — ALBUTEROL SULFATE (2.5 MG/3ML) 0.083% IN NEBU
INHALATION_SOLUTION | RESPIRATORY_TRACT | Status: AC
Start: 2024-02-29 — End: 2024-02-29
  Filled 2024-02-29: qty 3

## 2024-02-29 MED ORDER — ALBUTEROL SULFATE HFA 108 (90 BASE) MCG/ACT IN AERS: 2.0000 | INHALATION_SPRAY | Freq: Four times a day (QID) | RESPIRATORY_TRACT | 1 refills | Status: AC | PRN

## 2024-02-29 MED ORDER — ALBUTEROL SULFATE (2.5 MG/3ML) 0.083% IN NEBU
2.5000 mg | INHALATION_SOLUTION | Freq: Once | RESPIRATORY_TRACT | Status: AC
  Administered 2024-02-29: 2.5 mg via RESPIRATORY_TRACT

## 2024-02-29 NOTE — ED Provider Notes (Signed)
 [facility]    CSN: [id number] Arrival date & time: 02/29/24  1625      History   Chief Complaint Chief Complaint  Patient presents with   Nasal Congestion    Nasal and chest congestion CoughLow grade fever seemsChest feels tight and hard to breathe at times - Entered by patient    HPI Alexis Mills is a 28 y.o. female.  2 day history of nasal congestion, cough, ear pain. She has chest tightness with deep breath and cough. Tmax 100.3. 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 Authorizing Provider  albuterol  (VENTOLIN  HFA) 108 (90 Base) MCG/ACT inhaler Inhale 2 puffs into the lungs every 6 (six) hours as needed for wheezing or shortness of breath. 02/29/24  Yes Lindel Marcell, Asberry, PA-C  benzonatate  (TESSALON ) 100 MG capsule Take 1 capsule (100 mg total) by mouth 3 (three) times daily as needed for cough. 02/29/24  Yes Corley Maffeo, Asberry, PA-C  fexofenadine-pseudoephedrine (ALLEGRA-D 24) 180-240 MG per 24 hr tablet Take 1 tablet by mouth daily.   Yes [provider]  fluticasone  (FLONASE) 50 MCG/ACT nasal spray Place 2 sprays into the nose 2 (two) times daily.   Yes [provider]  ipratropium (ATROVENT ) 0.03 % nasal spray Place 2 sprays into both nostrils every 12 (twelve) hours. 02/28/24  Yes Theotis Haze ORN, NP  levETIRAcetam  (KEPPRA  XR) 750 MG 24 hr tablet TAKE 2 TABLETS BY MOUTH AT  NIGHT 01/26/24  Yes Georjean Darice HERO, MD  Multiple Vitamins-Minerals (MULTIVITAMIN WITH MINERALS) tablet Take 1 tablet by mouth daily.   Yes [provider]  NAYZILAM  5 MG/0.1ML SOLN Place 5 mg into the nose as needed. For seizures lasting longer than 5 minutes 09/04/23  Yes Georjean Darice HERO, MD  nortriptyline  (PAMELOR ) 10 MG capsule Take 2 capsules every night 02/26/24  Yes Georjean Darice HERO, MD  SUMAtriptan  (IMITREX ) 50 MG tablet Take 1 tablet at onset of migraine. May repeat in 2 hours if headache persists or recurs. Do not take more than 3 a week 01/26/24  Yes Georjean Darice HERO, MD  topiramate  (TOPAMAX ) 100 MG tablet Take 1 tablet BID 01/26/24  Yes Georjean Darice HERO, MD    Family History Family History  Adopted: Yes    Social History Social History   Tobacco Use  Smoking status: Never    Passive exposure: Never   Smokeless tobacco: Never  Vaping Use   Vaping status: Never Used  Substance Use Topics   Alcohol use: Not Currently   Drug use: No     Allergies   Other   Review of Systems Review of Systems   Physical Exam Triage Vital Signs ED Triage Vitals  Encounter Vitals Group     BP 02/29/24 1704 133/88     Girls Systolic BP Percentile --      Girls Diastolic BP Percentile --      Boys Systolic BP Percentile --      Boys Diastolic BP Percentile --      Pulse Rate 02/29/24 1704 (!) 119     Resp 02/29/24 1704 18     Temp 02/29/24 1704 98.5 F (36.9 C)     Temp Source 02/29/24 1704 Oral     SpO2 02/29/24 1704 97 %     Weight --      Height --      Head Circumference --      Peak Flow --      Pain Score 02/29/24 1709 6     Pain Loc --       Pain Education --      Exclude from Growth Chart --    No data found.  Updated Vital Signs BP 133/88 (BP Location: Left Arm)   Pulse 96   Temp 98.3 F (36.8 C) (Oral)   Resp 18   LMP 02/14/2024 (Approximate)   SpO2 97%    Physical Exam Vitals and nursing note reviewed.  Constitutional:      Appearance: She is not ill-appearing.  HENT:     Right Ear: Tympanic membrane and ear canal normal.     Left Ear: Tympanic membrane and ear canal normal.     Nose: Congestion present. No rhinorrhea.     Mouth/Throat:     Mouth: Mucous membranes are moist.     Pharynx: Oropharynx is clear. No posterior oropharyngeal erythema.  Eyes:     Conjunctiva/sclera: Conjunctivae normal.  Cardiovascular:     Rate and Rhythm: Normal rate and regular rhythm.     Pulses: Normal pulses.     Heart sounds: Normal heart sounds.  Pulmonary:     Effort: Pulmonary effort is normal.     Breath sounds: Normal breath sounds. No wheezing, rhonchi or rales.  Musculoskeletal:     Cervical back: Normal range of motion. No rigidity or tenderness.  Lymphadenopathy:     Cervical: No cervical adenopathy.  Skin:    General: Skin is warm and dry.  Neurological:     Mental Status: She is alert and oriented to person, place, and time.     UC Treatments / Results  Labs (all labs ordered are listed, but only abnormal results are displayed) Labs Reviewed  POC SARS CORONAVIRUS 2 AG -  ED    EKG  Radiology No results found.  Procedures Procedures  Medications Ordered in UC Medications  albuterol  (PROVENTIL ) (2.5 MG/3ML) 0.083% nebulizer solution 2.5 mg (2.5 mg Nebulization Given 02/29/24 1803)    Initial Impression / Assessment and Plan / UC Course  I have reviewed the triage vital signs and the nursing notes.  Pertinent labs & imaging results that were available during my care of the patient were reviewed by me and considered in my medical decision making (see chart for details).  Afebrile in clinic.  Well appearing. Speaks in  full sentences, no respiratory distress. Rapid covid negative.   Chest tightness although no wheezing on exam, an albuterol  nebulizer was given. Patient reports much improvement, all tightness resolved. I have sent albuterol  inhaler to pharmacy, use TID for several days. Continue mucinex. Try tessalon  perles. Other supportive care advised. Return precautions Agrees to plan, no questions Note for work provided   Final Clinical Impressions(s) / UC Diagnoses   Final diagnoses:  Viral URI with cough     Discharge Instructions      Please use the inhaler 3 times daily (every 6 hours) for the next several days. Then continue as needed.  The tessalon  cough pills can be taken 3 times daily, 1-2 pills at a time.  Continue mucinex with lots of water! Continue tylenol and/or ibuprofen if needed  Allow 4-5 days for noticeable improvement. Please return if needed     ED Prescriptions     Medication Sig Dispense Auth. Provider   albuterol  (VENTOLIN  HFA) 108 (90 Base) MCG/ACT inhaler Inhale 2 puffs into the lungs every 6 (six) hours as needed for wheezing or shortness of breath. 8 g Angie Piercey, PA-C   benzonatate  (TESSALON ) 100 MG capsule Take 1 capsule (100 mg total) by mouth 3 (three) times daily as needed for cough. 30 capsule Purvis Sidle, Asberry, PA-C      PDMP not reviewed this encounter.   Jeryl Asberry, [place] 02/29/24 8152

## 2024-02-29 NOTE — ED Triage Notes (Signed)
 Patient presents to the office for chest congestion and tightness that started x 2 days ago. Patient reports bilateral ear pain. Patient reports low grade temp.

## 2024-02-29 NOTE — Discharge Instructions (Signed)
 Please use the inhaler 3 times daily (every 6 hours) for the next several days. Then continue as needed.  The tessalon  cough pills can be taken 3 times daily, 1-2 pills at a time.  Continue mucinex with lots of water! Continue tylenol and/or ibuprofen if needed  Allow 4-5 days for noticeable improvement. Please return if needed

## 2024-03-18 ENCOUNTER — Encounter

## 2024-03-19 ENCOUNTER — Encounter: Payer: Self-pay | Admitting: Family

## 2024-03-19 DIAGNOSIS — J020 Streptococcal pharyngitis: Secondary | ICD-10-CM

## 2024-03-20 ENCOUNTER — Ambulatory Visit (HOSPITAL_COMMUNITY)

## 2024-03-21 NOTE — Telephone Encounter (Signed)
yes, thanks

## 2024-04-16 ENCOUNTER — Telehealth: Admitting: Nurse Practitioner

## 2024-04-16 DIAGNOSIS — J028 Acute pharyngitis due to other specified organisms: Secondary | ICD-10-CM | POA: Diagnosis not present

## 2024-04-16 DIAGNOSIS — B9689 Other specified bacterial agents as the cause of diseases classified elsewhere: Secondary | ICD-10-CM

## 2024-04-16 MED ORDER — AMOXICILLIN 500 MG PO CAPS: 500.0000 mg | ORAL_CAPSULE | Freq: Two times a day (BID) | ORAL | 0 refills | Status: AC

## 2024-04-16 NOTE — Progress Notes (Signed)
 We are sorry that you are not feeling well.  Here is how we plan to help!  Based on what you have shared with me it is likely that you have strep pharyngitis.  Strep pharyngitis is inflammation and infection in the back of the throat.  This is an infection cause by bacteria and is treated with antibiotics.  I have prescribed Amoxicillin  500 mg twice a day for 10 days. For throat pain, we recommend over the counter oral pain relief medications such as acetaminophen or aspirin, or anti-inflammatory medications such as ibuprofen or naproxen sodium. Topical treatments such as oral throat lozenges or sprays may be used as needed. Strep infections are not as easily transmitted as other respiratory infections, however we still recommend that you avoid close contact with loved ones, especially the very young and elderly.  Remember to wash your hands thoroughly throughout the day as this is the number one way to prevent the spread of infection and wipe down door knobs and counters with disinfectant.   Home Care: Only take medications as instructed by your medical team. Complete the entire course of an antibiotic. Do not take these medications with alcohol. A steam or ultrasonic humidifier can help congestion.  You can place a towel over your head and breathe in the steam from hot water coming from a faucet. Avoid close contacts especially the very young and the elderly. Cover your mouth when you cough or sneeze. Always remember to wash your hands.  Get Help Right Away If: You develop worsening fever or sinus pain. You develop a severe head ache or visual changes. Your symptoms persist after you have completed your treatment plan.  Make sure you Understand these instructions. Will watch your condition. Will get help right away if you are not doing well or get worse.  Your e-visit answers were reviewed by a board certified advanced clinical practitioner to complete your personal care plan.  Depending on  the condition, your plan could have included both over the counter or prescription medications.  If there is a problem please reply  once you have received a response from your provider.  Your safety is important to us .  If you have drug allergies check your prescription carefully.    You can use MyChart to ask questions about today's visit, request a non-urgent call back, or ask for a work or school excuse for 24 hours related to this e-Visit. If it has been greater than 24 hours you will need to follow up with your provider, or enter a new e-Visit to address those concerns.  You will get an e-mail in the next two days asking about your experience.  I hope that your e-visit has been valuable and will speed your recovery. Thank you for using e-visits.   I have spent 5 minutes in review of e-visit questionnaire, review and updating patient chart, medical decision making and response to patient.   Zo Loudon W Collis Thede, NP

## 2024-04-18 ENCOUNTER — Encounter: Payer: Self-pay | Admitting: Family

## 2024-04-18 NOTE — Telephone Encounter (Signed)
 LVM to schedule ov with any available provider

## 2024-05-22 ENCOUNTER — Encounter: Payer: Self-pay | Admitting: Neurology

## 2024-05-27 ENCOUNTER — Encounter: Payer: Self-pay | Admitting: Neurology

## 2024-05-30 ENCOUNTER — Encounter: Payer: Self-pay | Admitting: Neurology

## 2024-05-30 ENCOUNTER — Telehealth: Payer: Self-pay | Admitting: Neurology

## 2024-05-30 DIAGNOSIS — G40B09 Juvenile myoclonic epilepsy, not intractable, without status epilepticus: Secondary | ICD-10-CM

## 2024-05-30 NOTE — Addendum Note (Signed)
 Addended by: TAFT, Ramya Vanbergen  on: 05/30/2024 09:31 AM   Modules accepted: Orders

## 2024-05-30 NOTE — Telephone Encounter (Signed)
 Pt c/o: seizure Missed medications?  No. Sleep deprived?  No. Alcohol intake?  No. Increased stress? Yes.   A little not anything major,  Any change in medication color or shape? No. Any trigger? no Back to their usual baseline self?  Yes.  . If no, advise go to ER Current medications prescribed by Dr. Georjean:  topiramate  (TOPAMAX ) 100 MG tablet Take 1 tablet BID  levETIRAcetam  (KEPPRA  XR) 750 MG 24 hr tablet TAKE 2 TABLETS BY MOUTH AT NIGHT  NAYZILAM  5 MG/0.1ML SOLN   Pt transferred to the front to get scheduled for EEG

## 2024-05-30 NOTE — Telephone Encounter (Signed)
 Pt. Calling on update to Mychart message sent to Dr. Georjean

## 2024-05-30 NOTE — Telephone Encounter (Signed)
 Pls let her know I'm not sure if this is seizure or post-migraine related. Pls ask seizure questions. There are openings for EEG this afternoon if she can come in and we can see what brain waves show while she is feeling this way.

## 2024-05-31 ENCOUNTER — Ambulatory Visit: Admitting: Neurology

## 2024-06-01 ENCOUNTER — Ambulatory Visit

## 2024-06-01 ENCOUNTER — Encounter: Payer: Self-pay | Admitting: Family

## 2024-06-01 ENCOUNTER — Ambulatory Visit: Payer: Self-pay | Admitting: Neurology

## 2024-06-01 DIAGNOSIS — G40B09 Juvenile myoclonic epilepsy, not intractable, without status epilepticus: Secondary | ICD-10-CM | POA: Diagnosis not present

## 2024-06-01 NOTE — Procedures (Signed)
 ELECTROENCEPHALOGRAM REPORT  Date of Study: 06/01/2024  Patient's Name: Namrata Dangler MRN: [id number] Date of Birth: [birth year]  Referring Provider: Dr. Darice Shivers  Clinical History: This is a 28 year old woman with new onset feeling like she had a seizure, not right.. EEG for classification.  CNS Active Medications: Keppra , Topamax , Nortriptyline , sumatriptan   Technical Summary: A multichannel digital EEG recording measured by the international 10-20 system with electrodes applied with paste and impedances below 5000 ohms performed in our laboratory with EKG monitoring in an awake and drowsy patient.  Hyperventilation and photic stimulation were not performed.  The digital EEG was referentially recorded, reformatted, and digitally filtered in a variety of bipolar and referential montages for optimal display.    Description: The patient is awake and drowsy during the recording.  During maximal wakefulness, there is a symmetric, medium voltage 10 Hz posterior dominant rhythm that attenuates with eye opening.  The record is symmetric.  During drowsiness, there is an increase in theta slowing of the background. Sleep was not captured. She reported having an aura feeling not in control of her body, almost lightheaded, static feeling internally. Electrographically, there were no EEG or EKG changes seen. There were no epileptiform discharges or electrographic seizures seen.    EKG lead was unremarkable.  Impression: This awake and drowsy EEG is normal.  Episode as noted above did not show electrographic correlate.  Clinical Correlation: A normal EEG does not exclude a clinical diagnosis of epilepsy.  If further clinical questions remain, prolonged EEG may be helpful.  Clinical correlation is advised.   Darice Shivers, M.D.

## 2024-06-02 ENCOUNTER — Emergency Department (HOSPITAL_BASED_OUTPATIENT_CLINIC_OR_DEPARTMENT_OTHER)
Admission: EM | Admit: 2024-06-02 | Discharge: 2024-06-02 | Disposition: A | Discharge status: Home / Self Care | Attending: Emergency Medicine | Admitting: Emergency Medicine

## 2024-06-02 ENCOUNTER — Other Ambulatory Visit: Payer: Self-pay

## 2024-06-02 ENCOUNTER — Encounter (HOSPITAL_BASED_OUTPATIENT_CLINIC_OR_DEPARTMENT_OTHER): Payer: Self-pay

## 2024-06-02 DIAGNOSIS — T162XXA Foreign body in left ear, initial encounter: Secondary | ICD-10-CM | POA: Diagnosis not present

## 2024-06-02 DIAGNOSIS — R7401 Elevation of levels of liver transaminase levels: Secondary | ICD-10-CM | POA: Diagnosis not present

## 2024-06-02 DIAGNOSIS — R42 Dizziness and giddiness: Secondary | ICD-10-CM

## 2024-06-02 DIAGNOSIS — W44F9XA Other object of natural or organic material, entering into or through a natural orifice, initial encounter: Secondary | ICD-10-CM | POA: Insufficient documentation

## 2024-06-02 DIAGNOSIS — H81392 Other peripheral vertigo, left ear: Secondary | ICD-10-CM | POA: Insufficient documentation

## 2024-06-02 LAB — CBC WITH DIFFERENTIAL/PLATELET
Abs Immature Granulocytes: 0.11 K/uL — ABNORMAL HIGH (ref 0.00–0.07)
Basophils Absolute: 0.1 K/uL (ref 0.0–0.1)
Basophils Relative: 1 %
Eosinophils Absolute: 0.1 K/uL (ref 0.0–0.5)
Eosinophils Relative: 1 %
HCT: 44.7 % (ref 36.0–46.0)
Hemoglobin: 14.6 g/dL (ref 12.0–15.0)
Immature Granulocytes: 1 %
Lymphocytes Relative: 29 %
Lymphs Abs: 2.9 K/uL (ref 0.7–4.0)
MCH: 27.6 pg (ref 26.0–34.0)
MCHC: 32.7 g/dL (ref 30.0–36.0)
MCV: 84.5 fL (ref 80.0–100.0)
Monocytes Absolute: 0.4 K/uL (ref 0.1–1.0)
Monocytes Relative: 4 %
Neutro Abs: 6.4 K/uL (ref 1.7–7.7)
Neutrophils Relative %: 64 %
Platelets: 276 K/uL (ref 150–400)
RBC: 5.29 MIL/uL — ABNORMAL HIGH (ref 3.87–5.11)
RDW: 13.2 % (ref 11.5–15.5)
WBC: 10.1 K/uL (ref 4.0–10.5)
nRBC: 0 % (ref 0.0–0.2)

## 2024-06-02 LAB — COMPREHENSIVE METABOLIC PANEL WITH GFR
ALT: 102 U/L — ABNORMAL HIGH (ref 0–44)
AST: 40 U/L (ref 15–41)
Albumin: 4.6 g/dL (ref 3.5–5.0)
Alkaline Phosphatase: 59 U/L (ref 38–126)
Anion gap: 10 (ref 5–15)
BUN: 10 mg/dL (ref 6–20)
CO2: 26 mmol/L (ref 22–32)
Calcium: 9.6 mg/dL (ref 8.9–10.3)
Chloride: 103 mmol/L (ref 98–111)
Creatinine, Ser: 0.75 mg/dL (ref 0.44–1.00)
GFR, Estimated: >60 mL/min (ref >60)
Glucose, Bld: 102 mg/dL — ABNORMAL HIGH (ref 70–99)
Potassium: 4 mmol/L (ref 3.5–5.1)
Sodium: 138 mmol/L (ref 135–145)
Total Bilirubin: 0.3 mg/dL (ref 0.0–1.2)
Total Protein: 7.9 g/dL (ref 6.5–8.1)

## 2024-06-02 LAB — URINALYSIS, W/ REFLEX TO CULTURE (INFECTION SUSPECTED)
Bilirubin Urine: NEGATIVE
Glucose, UA: NEGATIVE mg/dL
Hgb urine dipstick: NEGATIVE
Ketones, ur: NEGATIVE mg/dL
Leukocytes,Ua: NEGATIVE
Nitrite: NEGATIVE
Protein, ur: NEGATIVE mg/dL
Specific Gravity, Urine: 1.021 (ref 1.005–1.030)
pH: 6 (ref 5.0–8.0)

## 2024-06-02 LAB — TSH: TSH: 2.65 u[IU]/mL (ref 0.350–4.500)

## 2024-06-02 LAB — PREGNANCY, URINE: Preg Test, Ur: NEGATIVE

## 2024-06-02 MED ORDER — MECLIZINE HCL 25 MG PO TABS
25.0000 mg | ORAL_TABLET | Freq: Once | ORAL | Status: AC
  Administered 2024-06-02: 25 mg via ORAL
  Filled 2024-06-02: qty 1

## 2024-06-02 MED ORDER — SODIUM CHLORIDE 0.9 % IV BOLUS
1000.0000 mL | Freq: Once | INTRAVENOUS | Status: AC
  Administered 2024-06-02: 1000 mL via INTRAVENOUS

## 2024-06-02 MED ORDER — MECLIZINE HCL 25 MG PO TABS: 25.0000 mg | ORAL_TABLET | Freq: Three times a day (TID) | ORAL | 0 refills | Status: AC | PRN

## 2024-06-02 NOTE — ED Provider Notes (Signed)
 [place] EMERGENCY DEPARTMENT AT [facility] Provider Note  CSN: [id number] Arrival date & time: 06/02/24 9353  Chief Complaint(s) Dizziness  HPI Alexis Mills is a 28 y.o. female with a history of epilepsy, migraine headaches, takes Keppra  who is here today for dizziness and lightheadedness since Friday.  Patient said that she had an EEG yesterday which she reports was negative.  She states that she feels both dizzy and lightheaded, states that it seems to happen when she is at rest.  She checked her blood pressure and noticed that it was high with a reading of 157/102.  She denies any fever, chills or trouble with her vision.  She denies any headache.   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 Authorizing Provider  meclizine  (ANTIVERT ) 25 MG tablet Take 1 tablet (25 mg total) by mouth 3 (three) times daily as needed for dizziness. 06/02/24  Yes Mannie Pac T, DO  albuterol  (VENTOLIN  HFA) 108 (90 Base) MCG/ACT inhaler Inhale 2 puffs into the lungs every 6 (six) hours as needed for wheezing or shortness of breath. 02/29/24   Rising, Asberry, PA-C  benzonatate  (TESSALON ) 100 MG capsule Take 1 capsule (100 mg total) by mouth 3 (three) times daily as needed for cough. 02/29/24   Rising, Asberry, PA-C  fexofenadine-pseudoephedrine (ALLEGRA-D 24) 180-240 MG per 24 hr tablet Take 1 tablet by mouth daily.    [provider]  fluticasone (FLONASE) 50 MCG/ACT nasal  spray Place 2 sprays into the nose 2 (two) times daily.    [provider]  ipratropium (ATROVENT ) 0.03 % nasal spray Place 2 sprays into both nostrils every 12 (twelve) hours. 02/28/24   Fleming, Zelda W, NP  levETIRAcetam  (KEPPRA  XR) 750 MG 24 hr tablet TAKE 2 TABLETS BY MOUTH AT  NIGHT 01/26/24   Georjean Darice HERO, MD  Multiple Vitamins-Minerals (MULTIVITAMIN WITH MINERALS) tablet Take 1 tablet by mouth daily.    [provider]  NAYZILAM  5 MG/0.1ML SOLN Place 5 mg into the nose as needed. For seizures lasting longer than 5 minutes 09/04/23   Georjean Darice HERO, MD  nortriptyline  (PAMELOR ) 10 MG capsule Take 2 capsules every night 02/26/24   Georjean Darice HERO, MD  SUMAtriptan  (IMITREX ) 50 MG tablet Take 1 tablet at onset of migraine. May repeat in 2 hours if headache persists or recurs. Do not take more than 3 a week 01/26/24   Georjean Darice HERO, MD  topiramate  (TOPAMAX ) 100 MG tablet Take 1 tablet BID 01/26/24   Georjean Darice HERO, MD  Past Surgical History Past Surgical History:  Procedure Laterality Date   COLPOSCOPY  09/06/2019   NO PAST SURGERIES     Family History Family History  Adopted: Yes    Social History Social History   Tobacco Use   Smoking status: Never    Passive exposure: Never   Smokeless tobacco: Never  Vaping Use   Vaping status: Never Used  Substance Use Topics   Alcohol use: Not Currently   Drug use: No   Allergies Other  Review of Systems Review of Systems  Physical Exam Vital Signs  I have reviewed the triage vital signs BP 137/86   Pulse 90   Temp 98.2 F (36.8 C) (Oral)   Resp 17   Ht 5' 1 (1.549 m)   Wt 96.2 kg   LMP 04/27/2024   SpO2 100%   BMI 40.06 kg/m   Physical Exam Vitals and nursing note reviewed.  Constitutional:      Appearance: Normal appearance.  HENT:     Head: Normocephalic and  atraumatic.     Right Ear: Tympanic membrane normal.     Ears:     Comments: Left TM-large, thick black hair impacted against the tympanic membrane Eyes:     Extraocular Movements: Extraocular movements intact.     Pupils: Pupils are equal, round, and reactive to light.  Cardiovascular:     Rate and Rhythm: Normal rate.  Pulmonary:     Effort: Pulmonary effort is normal.  Abdominal:     General: Abdomen is flat.  Musculoskeletal:        General: Normal range of motion.     Cervical back: Normal range of motion.  Skin:    General: Skin is warm.  Neurological:     General: No focal deficit present.     Mental Status: She is alert.     Motor: No weakness.     Comments: Cranial nerves II through XII intact.  Normal speech.  Patient with normal gait, was able to ambulate in the room with regular steps, no broad-based gait, no instability.  Turned around without any difficulty and sat down from the bed all unassisted.     ED Results and Treatments Labs (all labs ordered are listed, but only abnormal results are displayed) Labs Reviewed  CBC WITH DIFFERENTIAL/PLATELET - Abnormal; Notable for the following components:      Result Value   RBC 5.29 (*)    Abs Immature Granulocytes 0.11 (*)    All other components within normal limits  COMPREHENSIVE METABOLIC PANEL WITH GFR - Abnormal; Notable for the following components:   Glucose, Bld 102 (*)    ALT 102 (*)    All other components within normal limits  URINALYSIS, W/ REFLEX TO CULTURE (INFECTION SUSPECTED) - Abnormal; Notable for the following components:   Bacteria, UA RARE (*)    All other components within normal limits  TSH  PREGNANCY, URINE  Radiology No results found.  Pertinent labs & imaging results that were available during my care of the patient were reviewed by me and considered in my medical  decision making (see MDM for details).  Medications Ordered in ED Medications  sodium chloride  0.9 % bolus 1,000 mL (0 mLs Intravenous Stopped 06/02/24 0850)  meclizine  (ANTIVERT ) tablet 25 mg (25 mg Oral Given 06/02/24 0850)                                                                                                                                     Procedures .Foreign Body Removal  Date/Time: 06/02/2024 9:25 AM  Performed by: Mannie Fairy DASEN, DO Authorized by: Mannie Fairy DASEN, DO  Consent: Verbal consent obtained Consent given by: patient Body area: ear Location details: left ear  Sedation: Patient sedated: no  Removal mechanism: alligator forceps and irrigation Complexity: simple 1 objects recovered. Objects recovered: hair Post-procedure assessment: foreign body removed    (including critical care time)  Medical Decision Making / ED Course   This patient presents to the ED for concern of dizziness and lightheadedness, this involves an extensive number of treatment options, and is a complaint that carries with it a high risk of complications and morbidity.  The differential diagnosis includes peripheral vertigo, foreign body in the ear, less likely posterior CVA, consider peripheral vertigo, tachycardia, orthostatic tachycardia.  MDM: On exam, patient is overall well-appearing.  She has a normal neurological exam, normal gait.  I do think the large hair in the patient's ear could be contribute to her symptoms.  Currently soaking with hydrogen peroxide to see if we can get it to move, we will plan to irrigate.  Patient has had some elevated heart rates here in the ED, as high as 120.  My EKG currently shows a heart rate of 105 with normal intervals, no ST segment depressions or elevations, normal axis.  I have low suspicion for PE given the patient's symptoms.  Have ordered a TSH.  Plan -patient's heart rate is consistently in the 80s, however when I enter the  room it does increase to the 110s.  Perhaps a little whitecoat anxiety.  Thyroid function is normal.  Urinalysis normal.  Mild elevation in the ALT, but otherwise unremarkable CMP.  She is not have any dizziness at this time.  May have simply been related to issue with the ear.  I also provided patient with some meclizine .  Will discharge patient, she will follow-up with her PCP.   Additional history obtained: -Additional history obtained from husband at bedside -External records from outside source obtained and reviewed including: Chart review including previous notes, labs, imaging, consultation notes   Lab Tests: -I ordered, reviewed, and interpreted labs.   The pertinent results include:   Labs Reviewed  CBC WITH DIFFERENTIAL/PLATELET - Abnormal; Notable for the following components:      Result Value   RBC 5.29 (*)  Abs Immature Granulocytes 0.11 (*)    All other components within normal limits  COMPREHENSIVE METABOLIC PANEL WITH GFR - Abnormal; Notable for the following components:   Glucose, Bld 102 (*)    ALT 102 (*)    All other components within normal limits  URINALYSIS, W/ REFLEX TO CULTURE (INFECTION SUSPECTED) - Abnormal; Notable for the following components:   Bacteria, UA RARE (*)    All other components within normal limits  TSH  PREGNANCY, URINE      EKG my independent review of the patient's EKG shows no ST segment depressions or elevations, no T wave inversions, no evidence of acute ischemia.  EKG Interpretation Date/Time:  Thursday June 02 2024 07:44:37 EST Ventricular Rate:  105 PR Interval:  150 QRS Duration:  89 QT Interval:  336 QTC Calculation: 444 R Axis:   99  Text Interpretation: Sinus tachycardia Borderline right axis deviation Low voltage, precordial leads Confirmed by Mannie Pac [id number]) on 06/02/2024 8:24:27 AM         Medicines ordered and prescription drug management: Meds ordered this encounter  Medications   sodium  chloride 0.9 % bolus 1,000 mL   meclizine  (ANTIVERT ) tablet 25 mg   meclizine  (ANTIVERT ) 25 MG tablet    Sig: Take 1 tablet (25 mg total) by mouth 3 (three) times daily as needed for dizziness.    Dispense:  30 tablet    Refill:  0    -I have reviewed the patients home medicines and have made adjustments as needed   Cardiac Monitoring: The patient was maintained on a cardiac monitor.  I personally viewed and interpreted the cardiac monitored which showed an underlying rhythm of: Normal sinus rhythm  Social Determinants of Health:  Factors impacting patients care include: Lack of access to primary care   Reevaluation: After the interventions noted above, I reevaluated the patient and found that they have :improved  Co morbidities that complicate the patient evaluation  Past Medical History:  Diagnosis Date   ADD (attention deficit disorder)    Migraine    Seizures (HCC)          Final Clinical Impression(s) / ED Diagnoses Final diagnoses:  Dizziness  Peripheral vertigo involving left ear  Foreign body of left ear, initial encounter     @PCDICTATION @    Mannie Pac T, DO 06/02/24 [id number]

## 2024-06-02 NOTE — ED Notes (Signed)
 DC paperwork given and verbally understood.

## 2024-06-02 NOTE — ED Triage Notes (Signed)
 Complaining of dizziness and lightheaded since Friday. Went yesterday for an MRI which was ok per her pcp. Still feels very dizzy so here to see what else could be causing it.

## 2024-06-02 NOTE — Discharge Instructions (Addendum)
 While you were in the emergency room, he had blood work done that was normal.  We removed a hair from your ear, which could have been contributing to your symptoms.  I recommend getting some over-the-counter earwax drops which can help loosen things in the ear to prevent it from happening again.  I have also sent a prescription for meclizine , which you may take if your dizziness returns.  Please follow-up with your primary care doctor.

## 2024-06-03 NOTE — Telephone Encounter (Signed)
 [facility] Call ID: [id number]  Caller: Edra Hint; Self  Ph: [phone]  Reason for the call: Caller stated she missed a call from the office and was calling them back.

## 2024-06-03 NOTE — Progress Notes (Signed)
 No answer at 12:07 06/03/2024

## 2024-06-06 NOTE — Telephone Encounter (Signed)
-----   Message from Darice CHRISTELLA Shivers sent at 06/01/2024  3:45 PM EST ----- Can you pls let her know the EEG is normal. I did not see any changes when she was feeling the aura. Has she tried to use the nasal spray? If not, can try using the spray and see if this helps with  her symptoms. Has she checked BP during them?  ----- Message ----- From: Shivers Darice CHRISTELLA, MD Sent: 06/01/2024   3:44 PM EST To: Darice CHRISTELLA Shivers, MD

## 2024-06-06 NOTE — Telephone Encounter (Signed)
 Pt called an informed the EEG is normal. Dr Georjean did not see any changes when she was feeling the aura. Has she tried to use the nasal spray? She has not tried the nasal spray yet, she went to the ER over the weekend they think that she may have vertigo she has an appointment with there PCP  If not, can try using the spray and see if this helps with her symptoms. Has she checked BP during them?  Her BP has been high she has an appointment with her PCP on Dec 5th

## 2024-06-17 ENCOUNTER — Ambulatory Visit: Admitting: Family

## 2024-06-17 ENCOUNTER — Encounter: Payer: Self-pay | Admitting: Family

## 2024-06-17 VITALS — BP 132/84 | HR 80 | Temp 97.9°F | Ht 61.0 in | Wt 212.4 lb

## 2024-06-17 DIAGNOSIS — I10 Essential (primary) hypertension: Secondary | ICD-10-CM | POA: Diagnosis not present

## 2024-06-17 MED ORDER — LISINOPRIL 5 MG PO TABS: 5.0000 mg | ORAL_TABLET | Freq: Every day | ORAL | 1 refills | Status: DC

## 2024-06-17 NOTE — Progress Notes (Signed)
 Patient ID: Alexis Mills, female    DOB: 09-Mar-1996, 28 y.o.   MRN: [id number]  Chief Complaint  Patient presents with   Dizziness    Pt was seen in ED on 11/20 for dizziness, lightheadedness along with hig blood pressure readings at home.   Discussed the use of AI scribe software for clinical note transcription with the patient, who gave verbal consent to proceed.  History of Present Illness Alexis Mills is a 28 year old female with epilepsy who presents with dizziness and elevated blood pressure.  She had an acute dizziness episode that led to an ER visit. Ear exam was unremarkable after cleaning. Possible epileptic event but EEG was normal via Neuro. Given Meclizine  dose in ER for possible vertigo which she did think helped,  and she has had no recurrent dizziness and has not needed the prescribed meclizine . She began checking blood pressure at home after her mother raised concern and has noted intermittently elevated readings, mainly diastolic, 158/102. In the ER her blood pressure was initially high but improved on repeat. She has no prior hypertension diagnosis and does not routinely monitor BP, but has had slightly elevated readings in the past. Her Apple Watch has notified her of heart rate fluctuations, which were evaluated in the ER. She feels under significant stress. Since the ER visit she has had no persistent dizziness, headaches, or other symptoms suggestive of complications from elevated blood pressure.  Assessment & Plan Essential hypertension Blood pressure elevated with hx of home reading 158/102 mmHg. Symptoms include dizziness but no headaches. Lisinopril  chosen for its tolerability and low side effects. - Prescribed lisinopril  at low dose of 5mg  daily. - Advised on lifestyle modifications: weight loss, reduced salt intake, increased water consumption, regular cardio exercise, and stress management. - Increased water intake up to 2.5 liters  daily, reduce caffeine intake. - Scheduled follow-up in 6w to reassess blood pressure.  Dizziness Intermittent dizziness with vertigo-like symptoms. Considered vertigo related to inner ear issue or autoimmune response. Symptoms are short-lived, can be triggered by stress, viral infections, or allergies. - Ok to take meclizine  25mg  tid for symptomatic relief, use as needed, consider splitting dose to reduce drowsiness. - Educated on vertigo symptoms and potential triggers.   Subjective:    Outpatient Medications Prior to Visit  Medication Sig Dispense Refill   albuterol  (VENTOLIN  HFA) 108 (90 Base) MCG/ACT inhaler Inhale 2 puffs into the lungs every 6 (six) hours as needed for wheezing or shortness of breath. 8 g 1   benzonatate  (TESSALON ) 100 MG capsule Take 1 capsule (100 mg total) by mouth 3 (three) times daily as needed for cough. 30 capsule 0   fexofenadine-pseudoephedrine (ALLEGRA-D 24) 180-240 MG per 24 hr tablet Take 1 tablet by mouth daily.     fluticasone (FLONASE) 50 MCG/ACT nasal spray Place 2 sprays into the nose 2 (two) times daily.     ipratropium (ATROVENT ) 0.03 % nasal spray Place 2 sprays into both nostrils every 12 (twelve) hours. 30 mL 0   levETIRAcetam  (KEPPRA  XR) 750 MG 24 hr tablet TAKE 2 TABLETS BY MOUTH AT  NIGHT 180 tablet 3   meclizine  (ANTIVERT ) 25 MG tablet Take 1 tablet (25 mg total) by mouth 3 (three) times daily as needed for dizziness. 30 tablet 0   Multiple Vitamins-Minerals (MULTIVITAMIN WITH MINERALS) tablet Take 1 tablet by mouth daily.     NAYZILAM  5 MG/0.1ML SOLN Place 5 mg into the nose as needed. For seizures lasting  longer than 5 minutes 5 each 5   nortriptyline  (PAMELOR ) 10 MG capsule Take 2 capsules every night 180 capsule 3   SUMAtriptan  (IMITREX ) 50 MG tablet Take 1 tablet at onset of migraine. May repeat in 2 hours if headache persists or recurs. Do not take more than 3 a week 30 tablet 3   topiramate  (TOPAMAX ) 100 MG tablet Take 1 tablet BID 180  tablet 3   No facility-administered medications prior to visit.   Past Medical History:  Diagnosis Date   ADD (attention deficit disorder)    Migraine    Seizures (HCC)    Past Surgical History:  Procedure Laterality Date   COLPOSCOPY  09/06/2019   NO PAST SURGERIES     Allergies  Allergen Reactions   Other     Seasonal Allergies      Objective:    Physical Exam Vitals and nursing note reviewed.  Constitutional:      Appearance: Normal appearance. She is obese.  Cardiovascular:     Rate and Rhythm: Normal rate and regular rhythm.  Pulmonary:     Effort: Pulmonary effort is normal.     Breath sounds: Normal breath sounds.  Musculoskeletal:        General: Normal range of motion.  Skin:    General: Skin is warm and dry.  Neurological:     Mental Status: She is alert.  Psychiatric:        Mood and Affect: Mood normal.        Behavior: Behavior normal.    BP 132/84 (BP Location: Left Arm, Patient Position: Sitting, Cuff Size: Large)   Pulse 80   Temp 97.9 F (36.6 C) (Temporal)   Ht 5' 1 (1.549 m)   Wt 212 lb 6.4 oz (96.3 kg)   LMP 06/09/2024 (Exact Date)   SpO2 97%   BMI 40.13 kg/m  Wt Readings from Last 3 Encounters:  06/17/24 212 lb 6.4 oz (96.3 kg)  06/02/24 212 lb (96.2 kg)  01/26/24 212 lb (96.2 kg)      Lucius Krabbe, NP

## 2024-06-19 ENCOUNTER — Encounter: Payer: Self-pay | Admitting: Physician Assistant

## 2024-06-19 ENCOUNTER — Telehealth: Admitting: Physician Assistant

## 2024-06-19 DIAGNOSIS — R350 Frequency of micturition: Secondary | ICD-10-CM

## 2024-06-19 NOTE — Progress Notes (Signed)
  Based on your answers, I do not feel you need to be treated for a UTI at this point.  I do encourage you to make sure you are staying well hydrated.    If your symptoms do not improve,or you are having symptoms that are common for a UTI, please feel free to start another e-visit or follow up with your PCP.   Urinary tract infections can be prevented by drinking plenty of water to keep your body hydrated.  Also be sure when you wipe, wipe from front to back and don't hold it in!  If possible, empty your bladder every 4 hours.  Your e-visit answers were reviewed by a board certified advanced clinical practitioner to complete your personal care plan.  Depending on the condition, your plan could have included both over the counter or prescription medications.  If there is a problem please reply  once you have received a response from your provider.  Your safety is important to us .  If you have drug allergies check your prescription carefully.    You can use MyChart to ask questions about today's visit, request a non-urgent call back, or ask for a work or school excuse for 24 hours related to this e-Visit. If it has been greater than 24 hours you will need to follow up with your provider, or enter a new e-Visit to address those concerns.   You will get an e-mail in the next two days asking about your experience.  I hope that your e-visit has been valuable and will speed your recovery. Thank you for using e-visits.  I have spent 5 minutes in review of e-visit questionnaire, review and updating patient chart, medical decision making and response to patient.   Kirk RAMAN Mayers, PA-C

## 2024-06-30 ENCOUNTER — Encounter: Payer: Self-pay | Admitting: Neurology

## 2024-07-29 ENCOUNTER — Encounter: Payer: Self-pay | Admitting: Family

## 2024-07-29 ENCOUNTER — Ambulatory Visit: Admitting: Family

## 2024-07-29 VITALS — BP 132/80 | HR 103 | Temp 97.5°F | Ht 61.0 in | Wt 215.1 lb

## 2024-07-29 DIAGNOSIS — I10 Essential (primary) hypertension: Secondary | ICD-10-CM

## 2024-07-29 MED ORDER — LISINOPRIL 5 MG PO TABS: 5.0000 mg | ORAL_TABLET | Freq: Every day | ORAL | 5 refills | Status: AC

## 2024-07-29 NOTE — Progress Notes (Signed)
 "  Patient ID: Alexis Mills, female    DOB: [birth year], 29 y.o.   MRN: [id number]  Chief Complaint  Patient presents with   Hypertension    Follow up  Discussed the use of AI scribe software for clinical note transcription with the patient, who gave verbal consent to proceed.  History of Present Illness Alexis Mills is a 29 year old female who presents for follow-up of her blood pressure management.  She started lisinopril  5mg  at bedtime after her last visit, and denies side effects. She is not checking her blood pressure at home. She previously had dizziness, but this has not recurred. She has migraines but no recent headaches. She occasionally notes elevated heart rate as recorded on her Apple Watch, without associated symptoms.  Assessment & Plan Essential hypertension Blood pressure 138/84 mmHg, systolic slightly elevated. No side effects from lisinopril . Heart rate occasionally elevated but asymptomatic. - Continue lisinopril  at current dose, 5mg , refill sent. - Rechecked blood pressure today, down slightly. - Continue to hydrate well daily, exercise as able, eat low sodium diet. - Order blood work to check potassium levels at next visit. - Scheduled follow-up in four months.  Subjective:    Outpatient Medications Prior to Visit  Medication Sig Dispense Refill   albuterol  (VENTOLIN  HFA) 108 (90 Base) MCG/ACT inhaler Inhale 2 puffs into the lungs every 6 (six) hours as needed for wheezing or shortness of breath. 8 g 1   benzonatate  (TESSALON ) 200 MG capsule Take 200 mg by mouth 3 (three) times daily as needed.     fexofenadine-pseudoephedrine (ALLEGRA-D 24) 180-240 MG per 24 hr tablet Take 1 tablet by mouth daily.     fluticasone (FLONASE) 50 MCG/ACT nasal spray Place 2 sprays into the nose 2 (two) times daily.     ipratropium (ATROVENT ) 0.03 % nasal spray Place 2 sprays into both nostrils every 12 (twelve) hours. 30 mL 0   levETIRAcetam  (KEPPRA  XR)  750 MG 24 hr tablet TAKE 2 TABLETS BY MOUTH AT  NIGHT 180 tablet 3   meclizine  (ANTIVERT ) 25 MG tablet Take 1 tablet (25 mg total) by mouth 3 (three) times daily as needed for dizziness. 30 tablet 0   Multiple Vitamins-Minerals (MULTIVITAMIN WITH MINERALS) tablet Take 1 tablet by mouth daily.     NAYZILAM  5 MG/0.1ML SOLN Place 5 mg into the nose as needed. For seizures lasting longer than 5 minutes 5 each 5   nortriptyline  (PAMELOR ) 10 MG capsule Take 2 capsules every night 180 capsule 3   SUMAtriptan  (IMITREX ) 50 MG tablet Take 1 tablet at onset of migraine. May repeat in 2 hours if headache persists or recurs. Do not take more than 3 a week 30 tablet 3   topiramate  (TOPAMAX ) 100 MG tablet Take 1 tablet BID 180 tablet 3   lisinopril  (ZESTRIL ) 5 MG tablet Take 1 tablet (5 mg total) by mouth daily. 30 tablet 1   No facility-administered medications prior to visit.   Past Medical History:  Diagnosis Date   ADD (attention deficit disorder)    Migraine    Seizures (HCC)    Past Surgical History:  Procedure Laterality Date   COLPOSCOPY  09/06/2019   NO PAST SURGERIES     Allergies[1]    Objective:    Physical Exam Vitals and nursing note reviewed.  Constitutional:      Appearance: Normal appearance. She is obese.  Cardiovascular:     Rate and Rhythm: Normal rate and regular rhythm.  Pulmonary:     Effort: Pulmonary effort is normal.     Breath sounds: Normal breath sounds.  Musculoskeletal:        General: Normal range of motion.  Skin:    General: Skin is warm and dry.  Neurological:     Mental Status: She is alert.  Psychiatric:        Mood and Affect: Mood normal.        Behavior: Behavior normal.    BP 132/80 (BP Location: Left Arm, Patient Position: Sitting, Cuff Size: Large)   Pulse (!) 103   Temp (!) 97.5 F (36.4 C) (Temporal)   Ht 5' 1 (1.549 m)   Wt 215 lb 2 oz (97.6 kg)   LMP 07/20/2024 (Approximate)   SpO2 98%   BMI 40.65 kg/m  Wt Readings from Last 3  Encounters:  07/29/24 215 lb 2 oz (97.6 kg)  06/17/24 212 lb 6.4 oz (96.3 kg)  06/02/24 212 lb (96.2 kg)      Lucius Krabbe, NP     [1]  Allergies Allergen Reactions   Other     Seasonal Allergies   "

## 2024-09-30 ENCOUNTER — Ambulatory Visit: Admitting: Neurology

## 2024-11-28 ENCOUNTER — Ambulatory Visit: Admitting: Family
